# Patient Record
Sex: Female | Born: 1950 | Hispanic: No | Smoking: Never smoker
Health system: Southern US, Community
[De-identification: ages and names within clinical notes are randomized; demographics above are authoritative.]

## PROBLEM LIST (undated history)

## (undated) DIAGNOSIS — E119 Type 2 diabetes mellitus without complications: Secondary | ICD-10-CM

## (undated) HISTORY — PX: ABDOMINAL HYSTERECTOMY: SHX81

## (undated) HISTORY — DX: Type 2 diabetes mellitus without complications: E11.9

## (undated) HISTORY — PX: TONSILLECTOMY AND ADENOIDECTOMY: SHX28

## (undated) HISTORY — PX: CATARACT EXTRACTION, BILATERAL: SHX1313

---

## 2002-03-09 ENCOUNTER — Encounter: Admission: RE | Admit: 2002-03-09 | Discharge: 2002-03-09 | Payer: Self-pay | Admitting: *Deleted

## 2002-03-09 ENCOUNTER — Encounter: Payer: Self-pay | Admitting: *Deleted

## 2014-10-26 ENCOUNTER — Encounter: Payer: Self-pay | Admitting: Nurse Practitioner

## 2014-10-26 ENCOUNTER — Encounter (INDEPENDENT_AMBULATORY_CARE_PROVIDER_SITE_OTHER): Payer: Self-pay

## 2014-10-26 ENCOUNTER — Ambulatory Visit (INDEPENDENT_AMBULATORY_CARE_PROVIDER_SITE_OTHER): Payer: BC Managed Care – PPO | Admitting: Nurse Practitioner

## 2014-10-26 ENCOUNTER — Ambulatory Visit (INDEPENDENT_AMBULATORY_CARE_PROVIDER_SITE_OTHER): Payer: BC Managed Care – PPO

## 2014-10-26 VITALS — BP 156/84 | HR 75 | Temp 97.0°F | Ht 65.0 in | Wt 221.4 lb

## 2014-10-26 DIAGNOSIS — Z0000 Encounter for general adult medical examination without abnormal findings: Secondary | ICD-10-CM

## 2014-10-26 DIAGNOSIS — I10 Essential (primary) hypertension: Secondary | ICD-10-CM

## 2014-10-26 DIAGNOSIS — R42 Dizziness and giddiness: Secondary | ICD-10-CM

## 2014-10-26 MED ORDER — LISINOPRIL 20 MG PO TABS: 20.0000 mg | ORAL_TABLET | Freq: Every day | ORAL | Status: DC

## 2014-10-26 NOTE — Patient Instructions (Signed)

## 2014-10-26 NOTE — Progress Notes (Signed)
   Subjective:    Patient ID: Emma Nunez, female    DOB: [birth year], 63 y.o.   MRN: [id number]  HPI Patient in today c/o dizziness especially in the mornings when she gets up- gets better throughout the day. Has been going on for several months.    Review of Systems  Constitutional: Negative.   HENT: Negative.   Respiratory: Negative.   Cardiovascular: Negative.   Gastrointestinal: Negative.   Genitourinary: Negative.   Neurological: Positive for dizziness.  Psychiatric/Behavioral: Negative.   All other systems reviewed and are negative.      Objective:   Physical Exam  Constitutional: She is oriented to person, place, and time. She appears well-developed and well-nourished.  HENT:  Nose: Nose normal.  Mouth/Throat: Oropharynx is clear and moist.  Eyes: EOM are normal.  Neck: Trachea normal, normal range of motion and full passive range of motion without pain. Neck supple. No JVD present. Carotid bruit is not present. No thyromegaly present.  Cardiovascular: Normal rate, regular rhythm, normal heart sounds and intact distal pulses.  Exam reveals no gallop and no friction rub.   No murmur heard. Pulmonary/Chest: Effort normal and breath sounds normal.  Abdominal: Soft. Bowel sounds are normal. She exhibits no distension and no mass. There is no tenderness.  Musculoskeletal: Normal range of motion.  Lymphadenopathy:    She has no cervical adenopathy.  Neurological: She is alert and oriented to person, place, and time. She has normal reflexes.  Skin: Skin is warm and dry.  Psychiatric: She has a normal mood and affect. Her behavior is normal. Judgment and thought content normal.   BP 156/84 mmHg  Pulse 75  Temp(Src) 97 F (36.1 C) (Oral)  Ht _0  (1.651 m)  Wt 221 lb 6.4 oz (100.426 kg)  BMI 36.84 kg/m2  [name], FNP Chest x ray- no acute findings-Preliminary reading by Ronnald Collum, FNP  [facility]         Assessment & Plan:  1. Annual physical  exam - DG Chest 2 View; Future - EKG 12-Lead - CMP14+EGFR - NMR, lipoprofile - Thyroid Panel With TSH  2. Vertigo - Anemia Profile B  3. Essential hypertension, benign Low NA diet - lisinopril (PRINIVIL,ZESTRIL) 20 MG tablet; Take 1 tablet (20 mg total) by mouth daily.  Dispense: 90 tablet; Refill: 3    Labs pending Health maintenance reviewed Diet and exercise encouraged Continue all meds Follow up  In 6 months   [place], FNP

## 2014-10-27 LAB — THYROID PANEL WITH TSH
Free Thyroxine Index: 2 (ref 1.2–4.9)
T3 Uptake Ratio: 22 % — ABNORMAL LOW (ref 24–39)
T4, Total: 9.1 ug/dL (ref 4.5–12.0)
TSH: 1.78 u[IU]/mL (ref 0.450–4.500)

## 2014-10-27 LAB — ANEMIA PROFILE B
Basophils Absolute: 0 10*3/uL (ref 0.0–0.2)
Basos: 0 %
Eos: 3 %
Eosinophils Absolute: 0.2 10*3/uL (ref 0.0–0.4)
Ferritin: 165 ng/mL — ABNORMAL HIGH (ref 15–150)
Folate: 9.1 ng/mL (ref >3.0)
HCT: 41.5 % (ref 34.0–46.6)
Hemoglobin: 13.6 g/dL (ref 11.1–15.9)
Immature Grans (Abs): 0 10*3/uL (ref 0.0–0.1)
Immature Granulocytes: 0 %
Iron Saturation: 25 % (ref 15–55)
Iron: 73 ug/dL (ref 35–155)
Lymphocytes Absolute: 2.5 10*3/uL (ref 0.7–3.1)
Lymphs: 29 %
MCH: 29.2 pg (ref 26.6–33.0)
MCHC: 32.8 g/dL (ref 31.5–35.7)
MCV: 89 fL (ref 79–97)
Monocytes Absolute: 0.4 10*3/uL (ref 0.1–0.9)
Monocytes: 5 %
Neutrophils Absolute: 5.5 10*3/uL (ref 1.4–7.0)
Neutrophils Relative %: 63 %
Platelets: 358 10*3/uL (ref 150–379)
RBC: 4.66 x10E6/uL (ref 3.77–5.28)
RDW: 13.3 % (ref 12.3–15.4)
Retic Ct Pct: 1.9 % (ref 0.6–2.6)
TIBC: 290 ug/dL (ref 250–450)
UIBC: 217 ug/dL (ref 150–375)
Vitamin B-12: 204 pg/mL — ABNORMAL LOW (ref 211–946)
WBC: 8.8 10*3/uL (ref 3.4–10.8)

## 2014-10-27 LAB — NMR, LIPOPROFILE
Cholesterol: 298 mg/dL — ABNORMAL HIGH (ref 100–199)
HDL Cholesterol by NMR: 36 mg/dL — ABNORMAL LOW (ref >39)
HDL Particle Number: 25.6 umol/L — ABNORMAL LOW (ref >=30.5)
LDL Particle Number: 2762 nmol/L — ABNORMAL HIGH (ref <1000)
LDL Size: 20.6 nm (ref >20.5)
LDL-C: 184 mg/dL — ABNORMAL HIGH (ref 0–99)
LP-IR Score: 90 — ABNORMAL HIGH (ref <=45)
Small LDL Particle Number: 1580 nmol/L — ABNORMAL HIGH (ref <=527)
Triglycerides by NMR: 392 mg/dL — ABNORMAL HIGH (ref 0–149)

## 2014-10-27 LAB — CMP14+EGFR
ALT: 32 IU/L (ref 0–32)
AST: 29 IU/L (ref 0–40)
Albumin/Globulin Ratio: 1.6 (ref 1.1–2.5)
Albumin: 4.5 g/dL (ref 3.6–4.8)
Alkaline Phosphatase: 128 IU/L — ABNORMAL HIGH (ref 39–117)
BUN/Creatinine Ratio: 16 (ref 11–26)
BUN: 12 mg/dL (ref 8–27)
CO2: 22 mmol/L (ref 18–29)
Calcium: 9.7 mg/dL (ref 8.7–10.3)
Chloride: 100 mmol/L (ref 97–108)
Creatinine, Ser: 0.73 mg/dL (ref 0.57–1.00)
GFR calc Af Amer: 101 mL/min/{1.73_m2} (ref >59)
GFR calc non Af Amer: 88 mL/min/{1.73_m2} (ref >59)
Globulin, Total: 2.8 g/dL (ref 1.5–4.5)
Glucose: 89 mg/dL (ref 65–99)
Potassium: 4.7 mmol/L (ref 3.5–5.2)
Sodium: 140 mmol/L (ref 134–144)
Total Bilirubin: 0.3 mg/dL (ref 0.0–1.2)
Total Protein: 7.3 g/dL (ref 6.0–8.5)

## 2014-10-29 ENCOUNTER — Encounter: Payer: Self-pay | Admitting: Nurse Practitioner

## 2014-10-29 DIAGNOSIS — E785 Hyperlipidemia, unspecified: Secondary | ICD-10-CM | POA: Insufficient documentation

## 2014-10-29 DIAGNOSIS — I10 Essential (primary) hypertension: Secondary | ICD-10-CM | POA: Insufficient documentation

## 2014-10-29 DIAGNOSIS — D519 Vitamin B12 deficiency anemia, unspecified: Secondary | ICD-10-CM | POA: Insufficient documentation

## 2014-10-31 ENCOUNTER — Telehealth: Payer: Self-pay | Admitting: Nurse Practitioner

## 2014-10-31 NOTE — Telephone Encounter (Signed)
Aware of lab results. New script sent in of lipitor.,  Needs to start B-12 injections.

## 2014-11-05 ENCOUNTER — Ambulatory Visit (INDEPENDENT_AMBULATORY_CARE_PROVIDER_SITE_OTHER): Payer: BC Managed Care – PPO | Admitting: *Deleted

## 2014-11-05 DIAGNOSIS — E538 Deficiency of other specified B group vitamins: Secondary | ICD-10-CM

## 2014-11-05 MED ORDER — ATORVASTATIN CALCIUM 40 MG PO TABS: 40.0000 mg | ORAL_TABLET | Freq: Every day | ORAL | Status: DC

## 2014-11-05 MED ORDER — CYANOCOBALAMIN 1000 MCG/ML IJ SOLN
1000.0000 ug | Freq: Every day | INTRAMUSCULAR | Status: AC
  Administered 2014-11-05 – 2014-11-09 (×5): 1000 ug via INTRAMUSCULAR

## 2014-11-05 NOTE — Patient Instructions (Signed)
Vitamin B12 Injections Every person needs vitamin B12. A deficiency develops when the body does not get enough of it. One way to overcome this is by getting B12 shots (injections). A B12 shot puts the vitamin directly into muscle tissue. This avoids any problems your body might have in absorbing it from food or a pill. In some people, the body has trouble using the vitamin correctly. This can cause a B12 deficiency. Not consuming enough of the vitamin can also cause a deficiency. Getting enough vitamin B12 can be hard for elderly people. Sometimes, they do not eat a well-balanced diet. The elderly are also more likely than younger people to have medical conditions or take medications that can lead to a deficiency. WHAT DOES VITAMIN B12 DO? Vitamin B12 does many things to help the body work right:  It helps the body make healthy red blood cells.  It helps maintain nerve cells.  It is involved in the body's process of converting food into energy (metabolism).  It is needed to make the genetic material in all cells (DNA). VITAMIN B12 FOOD SOURCES Most people get plenty of vitamin B12 through the foods they eat. It is present in:  Meat, fish, poultry, and eggs.  Milk and milk products.  It also is added when certain foods are made, including some breads, cereals and yogurts. The food is then called "fortified". CAUSES The most common causes of vitamin B12 deficiency are:  Pernicious anemia. The condition develops when the body cannot make enough healthy red blood cells. This stems from a lack of a protein made in the stomach (intrinsic factor). People without this protein cannot absorb enough vitamin B12 from food.  Malabsorption. This is when the body cannot absorb the vitamin. It can be caused by:  Pernicious anemia.  Surgery to remove part or all of the stomach can lead to malabsorption. Removal of part or all of the small intestine can also cause malabsorption.  Vegetarian diet.  People who are strict about not eating foods from animals could have trouble taking in enough vitamin B12 from diet alone.  Medications. Some medicines have been linked to B12 deficiency, such as Metformin (a drug prescribed for type 2 diabetes). Long-term use of stomach acid suppressants also can keep the vitamin from being absorbed.  Intestinal problems such as inflammatory bowel disease. If there are problems in the digestive tract, vitamin B12 may not be absorbed in good enough amounts. SYMPTOMS People who do not get enough B12 can develop problems. These can include:  Anemia. This is when the body has too few red blood cells. Red blood cells carry oxygen to the rest of the body. Without a healthy supply of red blood cells, people can feel:  Tired (fatigued).  Weak.  Severe anemia can cause:  Shortness of breath.  Dizziness.  Rapid heart rate.  Paleness.  Other Vitamin B12 deficiency symptoms include:  Diarrhea.  Numbness or tingling in the hands or feet.  Loss of appetite.  Confusion.  Sores on the tongue or in the mouth. LET YOUR CAREGIVER KNOW ABOUT:  Any allergies. It is very important to know if you are allergic or sensitive to cobalt. Vitamin B12 contains cobalt.  Any history of kidney disease.  All medications you are taking. Include prescription and over-the-counter medicines, herbs and creams.  Whether you are pregnant or breast-feeding.  If you have Leber's disease, a hereditary eye condition, vitamin B12 could make it worse. RISKS AND COMPLICATIONS Reactions to an injection are   usually temporary. They might include:  Pain at the injection site.  Redness, swelling or tenderness at the site.  Headache, dizziness or weakness.  Nausea, upset stomach or diarrhea.  Numbness or tingling.  Fever.  Joint pain.  Itching or rash. If a reaction does not go away in a short while, talk with your healthcare provider. A change in the way the shots are  given, or where they are given, might need to be made. BEFORE AN INJECTION To decide whether B12 injections are right for you, your healthcare provider will probably:  Ask about your medical history.  Ask questions about your diet.  Ask about symptoms such as:  Have you felt weak?  Do you feel unusually tired?  Do you get dizzy?  Order blood tests. These may include a test to:  Check the level of red cells in your blood.  Measure B12 levels.  Check for the presence of intrinsic factor. VITAMIN B12 INJECTIONS How often you will need a vitamin B12 injection will depend on how severe your deficiency is. This also will affect how long you will need to get them. People with pernicious anemia usually get injections for their entire life. Others might get them for a shorter period. For many people, injections are given daily or weekly for several weeks. Then, once B12 levels are normal, injections are given just once a month. If the cause of the deficiency can be fixed, the injections can be stopped. Talk with your healthcare provider about what you should expect. For an injection:  The injection site will be cleaned with an alcohol swab.  Your healthcare provider will insert a needle directly into a muscle. Most any muscle can be used. Most often, an arm muscle is used. A buttocks muscle can also be used. Many people say shots in that area are less painful.  A small adhesive bandage may be put over the injection site. It usually can be taken off in an hour or less. Injections can be given by your healthcare provider. In some cases, family members give them. Sometimes, people give them to themselves. Talk with your healthcare provider about what would be best for you. If someone other than your healthcare provider will be giving the shots, the person will need to be trained to give them correctly. HOME CARE INSTRUCTIONS   You can remove the adhesive bandage within an hour of getting a  shot.  You should be able to go about your normal activities right away.  Avoid drinking large amounts of alcohol while taking vitamin B12 shots. Alcohol can interfere with the body's use of the vitamin. SEEK MEDICAL CARE IF:   Pain, redness, swelling or tenderness at the injection site does not get better or gets worse.  Headache, dizziness or weakness does not go away.  You develop a fever of more than 100.5 F (38.1 C). SEEK IMMEDIATE MEDICAL CARE IF:   You have chest pain.  You develop shortness of breath.  You have muscle weakness that gets worse.  You develop numbness, weakness or tingling on one side or one area of the body.  You have symptoms of an allergic reaction, such as:  Hives.  Difficulty breathing.  Swelling of the lips, face, tongue or throat.  You develop a fever of more than 102.0 F (38.9 C). MAKE SURE YOU:   Understand these instructions.  Will watch your condition.  Will get help right away if you are not doing well or get worse. Document   Released: 03/05/2009 Document Revised: 02/29/2012 Document Reviewed: 03/05/2009 Exit[employer] Patient Information 2015 [employer]. This information is not intended to replace advice given to you by your health care provider. Make sure you discuss any questions you have with your health care provider.  

## 2014-11-05 NOTE — Progress Notes (Signed)
Vitamin b12 given and tolerated well. 

## 2014-11-06 ENCOUNTER — Ambulatory Visit (INDEPENDENT_AMBULATORY_CARE_PROVIDER_SITE_OTHER): Payer: BC Managed Care – PPO | Admitting: *Deleted

## 2014-11-06 DIAGNOSIS — E538 Deficiency of other specified B group vitamins: Secondary | ICD-10-CM

## 2014-11-06 NOTE — Progress Notes (Signed)
Patient ID: Emma Nunez, female   DOB: 1951/04/16, [age] y.o.   MRN: [id number] Pt given B12 injection Im right deltoid. Pt tolerated injection well.

## 2014-11-06 NOTE — Patient Instructions (Signed)

## 2014-11-07 ENCOUNTER — Ambulatory Visit (INDEPENDENT_AMBULATORY_CARE_PROVIDER_SITE_OTHER): Payer: BC Managed Care – PPO | Admitting: *Deleted

## 2014-11-07 DIAGNOSIS — E538 Deficiency of other specified B group vitamins: Secondary | ICD-10-CM

## 2014-11-07 NOTE — Progress Notes (Signed)
Patient ID: Emma Nunez, female   DOB: 06-18-1951, 63 y.o.   MRN: [id number] Pt tolerated inj well

## 2014-11-08 ENCOUNTER — Ambulatory Visit (INDEPENDENT_AMBULATORY_CARE_PROVIDER_SITE_OTHER): Payer: BC Managed Care – PPO | Admitting: *Deleted

## 2014-11-08 DIAGNOSIS — E538 Deficiency of other specified B group vitamins: Secondary | ICD-10-CM

## 2014-11-08 NOTE — Patient Instructions (Signed)
Vitamin B12 Injections Every person needs vitamin B12. A deficiency develops when the body does not get enough of it. One way to overcome this is by getting B12 shots (injections). A B12 shot puts the vitamin directly into muscle tissue. This avoids any problems your body might have in absorbing it from food or a pill. In some people, the body has trouble using the vitamin correctly. This can cause a B12 deficiency. Not consuming enough of the vitamin can also cause a deficiency. Getting enough vitamin B12 can be hard for elderly people. Sometimes, they do not eat a well-balanced diet. The elderly are also more likely than younger people to have medical conditions or take medications that can lead to a deficiency. WHAT DOES VITAMIN B12 DO? Vitamin B12 does many things to help the body work right:  It helps the body make healthy red blood cells.  It helps maintain nerve cells.  It is involved in the body's process of converting food into energy (metabolism).  It is needed to make the genetic material in all cells (DNA). VITAMIN B12 FOOD SOURCES Most people get plenty of vitamin B12 through the foods they eat. It is present in:  Meat, fish, poultry, and eggs.  Milk and milk products.  It also is added when certain foods are made, including some breads, cereals and yogurts. The food is then called "fortified". CAUSES The most common causes of vitamin B12 deficiency are:  Pernicious anemia. The condition develops when the body cannot make enough healthy red blood cells. This stems from a lack of a protein made in the stomach (intrinsic factor). People without this protein cannot absorb enough vitamin B12 from food.  Malabsorption. This is when the body cannot absorb the vitamin. It can be caused by:  Pernicious anemia.  Surgery to remove part or all of the stomach can lead to malabsorption. Removal of part or all of the small intestine can also cause malabsorption.  Vegetarian diet.  People who are strict about not eating foods from animals could have trouble taking in enough vitamin B12 from diet alone.  Medications. Some medicines have been linked to B12 deficiency, such as Metformin (a drug prescribed for type 2 diabetes). Long-term use of stomach acid suppressants also can keep the vitamin from being absorbed.  Intestinal problems such as inflammatory bowel disease. If there are problems in the digestive tract, vitamin B12 may not be absorbed in good enough amounts. SYMPTOMS People who do not get enough B12 can develop problems. These can include:  Anemia. This is when the body has too few red blood cells. Red blood cells carry oxygen to the rest of the body. Without a healthy supply of red blood cells, people can feel:  Tired (fatigued).  Weak.  Severe anemia can cause:  Shortness of breath.  Dizziness.  Rapid heart rate.  Paleness.  Other Vitamin B12 deficiency symptoms include:  Diarrhea.  Numbness or tingling in the hands or feet.  Loss of appetite.  Confusion.  Sores on the tongue or in the mouth. LET YOUR CAREGIVER KNOW ABOUT:  Any allergies. It is very important to know if you are allergic or sensitive to cobalt. Vitamin B12 contains cobalt.  Any history of kidney disease.  All medications you are taking. Include prescription and over-the-counter medicines, herbs and creams.  Whether you are pregnant or breast-feeding.  If you have Leber's disease, a hereditary eye condition, vitamin B12 could make it worse. RISKS AND COMPLICATIONS Reactions to an injection are   usually temporary. They might include:  Pain at the injection site.  Redness, swelling or tenderness at the site.  Headache, dizziness or weakness.  Nausea, upset stomach or diarrhea.  Numbness or tingling.  Fever.  Joint pain.  Itching or rash. If a reaction does not go away in a short while, talk with your healthcare provider. A change in the way the shots are  given, or where they are given, might need to be made. BEFORE AN INJECTION To decide whether B12 injections are right for you, your healthcare provider will probably:  Ask about your medical history.  Ask questions about your diet.  Ask about symptoms such as:  Have you felt weak?  Do you feel unusually tired?  Do you get dizzy?  Order blood tests. These may include a test to:  Check the level of red cells in your blood.  Measure B12 levels.  Check for the presence of intrinsic factor. VITAMIN B12 INJECTIONS How often you will need a vitamin B12 injection will depend on how severe your deficiency is. This also will affect how long you will need to get them. People with pernicious anemia usually get injections for their entire life. Others might get them for a shorter period. For many people, injections are given daily or weekly for several weeks. Then, once B12 levels are normal, injections are given just once a month. If the cause of the deficiency can be fixed, the injections can be stopped. Talk with your healthcare provider about what you should expect. For an injection:  The injection site will be cleaned with an alcohol swab.  Your healthcare provider will insert a needle directly into a muscle. Most any muscle can be used. Most often, an arm muscle is used. A buttocks muscle can also be used. Many people say shots in that area are less painful.  A small adhesive bandage may be put over the injection site. It usually can be taken off in an hour or less. Injections can be given by your healthcare provider. In some cases, family members give them. Sometimes, people give them to themselves. Talk with your healthcare provider about what would be best for you. If someone other than your healthcare provider will be giving the shots, the person will need to be trained to give them correctly. HOME CARE INSTRUCTIONS   You can remove the adhesive bandage within an hour of getting a  shot.  You should be able to go about your normal activities right away.  Avoid drinking large amounts of alcohol while taking vitamin B12 shots. Alcohol can interfere with the body's use of the vitamin. SEEK MEDICAL CARE IF:   Pain, redness, swelling or tenderness at the injection site does not get better or gets worse.  Headache, dizziness or weakness does not go away.  You develop a fever of more than 100.5 F (38.1 C). SEEK IMMEDIATE MEDICAL CARE IF:   You have chest pain.  You develop shortness of breath.  You have muscle weakness that gets worse.  You develop numbness, weakness or tingling on one side or one area of the body.  You have symptoms of an allergic reaction, such as:  Hives.  Difficulty breathing.  Swelling of the lips, face, tongue or throat.  You develop a fever of more than 102.0 F (38.9 C). MAKE SURE YOU:   Understand these instructions.  Will watch your condition.  Will get help right away if you are not doing well or get worse. Document   Released: 03/05/2009 Document Revised: 02/29/2012 Document Reviewed: 03/05/2009 Exit[employer] Patient Information 2015 [employer]. This information is not intended to replace advice given to you by your health care provider. Make sure you discuss any questions you have with your health care provider.  

## 2014-11-08 NOTE — Progress Notes (Signed)
Vitamin b12 given and tolerated well. 

## 2014-11-09 ENCOUNTER — Ambulatory Visit (INDEPENDENT_AMBULATORY_CARE_PROVIDER_SITE_OTHER): Payer: BC Managed Care – PPO | Admitting: *Deleted

## 2014-11-09 DIAGNOSIS — E538 Deficiency of other specified B group vitamins: Secondary | ICD-10-CM

## 2014-11-09 NOTE — Progress Notes (Signed)
Pt given B12 injection IM left deltoid, pt tolerated injection well. 

## 2014-11-09 NOTE — Patient Instructions (Signed)

## 2014-11-14 ENCOUNTER — Ambulatory Visit (INDEPENDENT_AMBULATORY_CARE_PROVIDER_SITE_OTHER): Payer: BC Managed Care – PPO | Admitting: *Deleted

## 2014-11-14 DIAGNOSIS — E538 Deficiency of other specified B group vitamins: Secondary | ICD-10-CM

## 2014-11-14 MED ORDER — CYANOCOBALAMIN 1000 MCG/ML IJ SOLN
1000.0000 ug | INTRAMUSCULAR | Status: DC
  Administered 2014-11-14 – 2014-12-05 (×4): 1000 ug via INTRAMUSCULAR

## 2014-11-14 NOTE — Progress Notes (Signed)
Pt given B12 injection IM Right deltoid, pt tolerated injection well.

## 2014-11-14 NOTE — Patient Instructions (Signed)

## 2014-11-21 ENCOUNTER — Ambulatory Visit (INDEPENDENT_AMBULATORY_CARE_PROVIDER_SITE_OTHER): Payer: BC Managed Care – PPO | Admitting: *Deleted

## 2014-11-21 DIAGNOSIS — E538 Deficiency of other specified B group vitamins: Secondary | ICD-10-CM

## 2014-11-21 NOTE — Progress Notes (Signed)
B12 injection given IM left deltoid. Pt tolerated injection well.

## 2014-11-21 NOTE — Patient Instructions (Signed)

## 2014-11-28 ENCOUNTER — Ambulatory Visit (INDEPENDENT_AMBULATORY_CARE_PROVIDER_SITE_OTHER): Payer: BC Managed Care – PPO | Admitting: *Deleted

## 2014-11-28 DIAGNOSIS — E538 Deficiency of other specified B group vitamins: Secondary | ICD-10-CM

## 2014-11-28 NOTE — Progress Notes (Signed)
Vitamin b12 injection given and tolerated well.  

## 2014-11-28 NOTE — Patient Instructions (Signed)

## 2014-12-05 ENCOUNTER — Ambulatory Visit (INDEPENDENT_AMBULATORY_CARE_PROVIDER_SITE_OTHER): Payer: BC Managed Care – PPO | Admitting: *Deleted

## 2014-12-05 DIAGNOSIS — E538 Deficiency of other specified B group vitamins: Secondary | ICD-10-CM

## 2014-12-05 NOTE — Progress Notes (Signed)
Pt given b12 injection IM left deltoid, pt tolerated injection well.

## 2014-12-05 NOTE — Patient Instructions (Signed)

## 2015-01-07 ENCOUNTER — Ambulatory Visit (INDEPENDENT_AMBULATORY_CARE_PROVIDER_SITE_OTHER): Payer: BLUE CROSS/BLUE SHIELD | Admitting: *Deleted

## 2015-01-07 DIAGNOSIS — E538 Deficiency of other specified B group vitamins: Secondary | ICD-10-CM

## 2015-01-07 MED ORDER — CYANOCOBALAMIN 1000 MCG/ML IJ SOLN
1000.0000 ug | INTRAMUSCULAR | Status: AC
  Administered 2015-01-07 – 2025-01-24 (×74): 1000 ug via INTRAMUSCULAR

## 2015-01-07 NOTE — Patient Instructions (Signed)

## 2015-01-07 NOTE — Progress Notes (Signed)
Vitamin b12 injection given and tolerated well.  

## 2015-02-07 ENCOUNTER — Ambulatory Visit (INDEPENDENT_AMBULATORY_CARE_PROVIDER_SITE_OTHER): Payer: BLUE CROSS/BLUE SHIELD | Admitting: Nurse Practitioner

## 2015-02-07 ENCOUNTER — Encounter: Payer: Self-pay | Admitting: Nurse Practitioner

## 2015-02-07 VITALS — BP 160/92 | HR 71 | Temp 97.8°F | Ht 65.0 in | Wt 224.0 lb

## 2015-02-07 DIAGNOSIS — D519 Vitamin B12 deficiency anemia, unspecified: Secondary | ICD-10-CM

## 2015-02-07 DIAGNOSIS — Z13820 Encounter for screening for osteoporosis: Secondary | ICD-10-CM

## 2015-02-07 DIAGNOSIS — I10 Essential (primary) hypertension: Secondary | ICD-10-CM

## 2015-02-07 DIAGNOSIS — R739 Hyperglycemia, unspecified: Secondary | ICD-10-CM

## 2015-02-07 DIAGNOSIS — Z23 Encounter for immunization: Secondary | ICD-10-CM

## 2015-02-07 DIAGNOSIS — E785 Hyperlipidemia, unspecified: Secondary | ICD-10-CM

## 2015-02-07 DIAGNOSIS — E538 Deficiency of other specified B group vitamins: Secondary | ICD-10-CM

## 2015-02-07 DIAGNOSIS — Z6837 Body mass index (BMI) 37.0-37.9, adult: Secondary | ICD-10-CM | POA: Insufficient documentation

## 2015-02-07 DIAGNOSIS — R799 Abnormal finding of blood chemistry, unspecified: Secondary | ICD-10-CM

## 2015-02-07 MED ORDER — LISINOPRIL 40 MG PO TABS
40.0000 mg | ORAL_TABLET | Freq: Every day | ORAL | Status: DC
Start: 2015-02-07 — End: 2015-12-04

## 2015-02-07 NOTE — Progress Notes (Signed)
Subjective:    Patient ID: Emma Nunez, female    DOB: 04/25/1951, 63 y.o.   MRN: [id number]  Hyperlipidemia This is a chronic problem. The current episode started more than 1 year ago. The problem is uncontrolled. Recent lipid tests were reviewed and are high. Exacerbating diseases include obesity. She has no history of diabetes or hypothyroidism. There are no known factors aggravating her hyperlipidemia. Pertinent negatives include no focal sensory loss, leg pain or shortness of breath. Treatments tried: patient stopped lipitor because made her feel bad. Only took for 2 weeks. The current treatment provides moderate improvement of lipids. Compliance problems include adherence to diet and adherence to exercise.  Risk factors for coronary artery disease include dyslipidemia, hypertension, obesity and post-menopausal.  Hypertension This is a chronic problem. The current episode started more than 1 year ago. The problem has been waxing and waning since onset. The problem is resistant. Pertinent negatives include no blurred vision, headaches, neck pain, palpitations, peripheral edema or shortness of breath. Risk factors for coronary artery disease include dyslipidemia, obesity and sedentary lifestyle. Past treatments include ACE inhibitors. The current treatment provides no improvement. Compliance problems include diet and exercise.       Review of Systems  Constitutional: Negative.   HENT: Negative.   Eyes: Negative for blurred vision.  Respiratory: Negative for shortness of breath.   Cardiovascular: Negative for palpitations.  Genitourinary: Negative.   Musculoskeletal: Negative for neck pain.  Neurological: Negative for headaches.  Psychiatric/Behavioral: Negative.   All other systems reviewed and are negative.      Objective:   Physical Exam  Constitutional: She is oriented to person, place, and time. She appears well-developed and well-nourished.  HENT:  Nose: Nose normal.    Mouth/Throat: Oropharynx is clear and moist.  Eyes: EOM are normal.  Neck: Trachea normal, normal range of motion and full passive range of motion without pain. Neck supple. No JVD present. Carotid bruit is not present. No thyromegaly present.  Cardiovascular: Normal rate, regular rhythm, normal heart sounds and intact distal pulses.  Exam reveals no gallop and no friction rub.   No murmur heard. Pulmonary/Chest: Effort normal and breath sounds normal.  Abdominal: Soft. Bowel sounds are normal. She exhibits no distension and no mass. There is no tenderness.  Musculoskeletal: Normal range of motion.  Lymphadenopathy:    She has no cervical adenopathy.  Neurological: She is alert and oriented to person, place, and time. She has normal reflexes.  Skin: Skin is warm and dry.  Psychiatric: She has a normal mood and affect. Her behavior is normal. Judgment and thought content normal.    BP 160/92 mmHg  Pulse 71  Temp(Src) 97.8 F (36.6 C) (Oral)  Ht 5' 5" (1.651 m)  Wt 224 lb (101.606 kg)  BMI 37.28 kg/m2        Assessment & Plan:  1. Essential hypertension, benign Do not add salt to diet Increased lisinopril form 20 mg daily 40mg daily - lisinopril (PRINIVIL,ZESTRIL) 40 MG tablet; Take 1 tablet (40 mg total) by mouth daily.  Dispense: 90 tablet; Refill: 3 - CMP14+EGFR  2. Hyperlipidemia with target LDL less than 100 Patient will start back on lipitor - NMR, lipoprofile  3. BMI 37.0-37.9, adult Discussed diet and exercise for person with BMI >25 Will recheck weight in 3-6 months    dexascan ordered Labs pending Health maintenance reviewed Diet and exercise encouraged Continue all meds Follow up  In 3 months   Syna-Margaret Martin, FNP    

## 2015-02-07 NOTE — Addendum Note (Signed)
Addended by: Cleda DaubUCKER, Mandela Bello G on: 02/07/2015 11:22 AM   Modules accepted: Orders

## 2015-02-07 NOTE — Patient Instructions (Signed)
Fat and Cholesterol Control Diet Fat and cholesterol levels in your blood and organs are influenced by your diet. High levels of fat and cholesterol may lead to diseases of the heart, small and large blood vessels, gallbladder, liver, and pancreas. CONTROLLING FAT AND CHOLESTEROL WITH DIET Although exercise and lifestyle factors are important, your diet is key. That is because certain foods are known to raise cholesterol and others to lower it. The goal is to balance foods for their effect on cholesterol and more importantly, to replace saturated and trans fat with other types of fat, such as monounsaturated fat, polyunsaturated fat, and omega-3 fatty acids. On average, a person should consume no more than 15 to 17 g of saturated fat daily. Saturated and trans fats are considered "bad" fats, and they will raise LDL cholesterol. Saturated fats are primarily found in animal products such as meats, butter, and cream. However, that does not mean you need to give up all your favorite foods. Today, there are good tasting, low-fat, low-cholesterol substitutes for most of the things you like to eat. Choose low-fat or nonfat alternatives. Choose round or loin cuts of red meat. These types of cuts are lowest in fat and cholesterol. Chicken (without the skin), fish, veal, and ground turkey breast are great choices. Eliminate fatty meats, such as hot dogs and salami. Even shellfish have little or no saturated fat. Have a 3 oz (85 g) portion when you eat lean meat, poultry, or fish. Trans fats are also called "partially hydrogenated oils." They are oils that have been scientifically manipulated so that they are solid at room temperature resulting in a longer shelf life and improved taste and texture of foods in which they are added. Trans fats are found in stick margarine, some tub margarines, cookies, crackers, and baked goods.  When baking and cooking, oils are a great substitute for butter. The monounsaturated oils are  especially beneficial since it is believed they lower LDL and raise HDL. The oils you should avoid entirely are saturated tropical oils, such as coconut and palm.  Remember to eat a lot from food groups that are naturally free of saturated and trans fat, including fish, fruit, vegetables, beans, grains (barley, rice, couscous, bulgur wheat), and pasta (without cream sauces).  IDENTIFYING FOODS THAT LOWER FAT AND CHOLESTEROL  Soluble fiber may lower your cholesterol. This type of fiber is found in fruits such as apples, vegetables such as broccoli, potatoes, and carrots, legumes such as beans, peas, and lentils, and grains such as barley. Foods fortified with plant sterols (phytosterol) may also lower cholesterol. You should eat at least 2 g per day of these foods for a cholesterol lowering effect.  Read package labels to identify low-saturated fats, trans fat free, and low-fat foods at the supermarket. Select cheeses that have only 2 to 3 g saturated fat per ounce. Use a heart-healthy tub margarine that is free of trans fats or partially hydrogenated oil. When buying baked goods (cookies, crackers), avoid partially hydrogenated oils. Breads and muffins should be made from whole grains (whole-wheat or whole oat flour, instead of "flour" or "enriched flour"). Buy non-creamy canned soups with reduced salt and no added fats.  FOOD PREPARATION TECHNIQUES  Never deep-fry. If you must fry, either stir-fry, which uses very little fat, or use non-stick cooking sprays. When possible, broil, bake, or roast meats, and steam vegetables. Instead of putting butter or margarine on vegetables, use lemon and herbs, applesauce, and cinnamon (for squash and sweet potatoes). Use nonfat   yogurt, salsa, and low-fat dressings for salads.  LOW-SATURATED FAT / LOW-FAT FOOD SUBSTITUTES Meats / Saturated Fat (g)  Avoid: Steak, marbled (3 oz/85 g) / 11 g  Choose: Steak, lean (3 oz/85 g) / 4 g  Avoid: Hamburger (3 oz/85 g) / 7  g  Choose: Hamburger, lean (3 oz/85 g) / 5 g  Avoid: Ham (3 oz/85 g) / 6 g  Choose: Ham, lean cut (3 oz/85 g) / 2.4 g  Avoid: Chicken, with skin, dark meat (3 oz/85 g) / 4 g  Choose: Chicken, skin removed, dark meat (3 oz/85 g) / 2 g  Avoid: Chicken, with skin, light meat (3 oz/85 g) / 2.5 g  Choose: Chicken, skin removed, light meat (3 oz/85 g) / 1 g Dairy / Saturated Fat (g)  Avoid: Whole milk (1 cup) / 5 g  Choose: Low-fat milk, 2% (1 cup) / 3 g  Choose: Low-fat milk, 1% (1 cup) / 1.5 g  Choose: Skim milk (1 cup) / 0.3 g  Avoid: Hard cheese (1 oz/28 g) / 6 g  Choose: Skim milk cheese (1 oz/28 g) / 2 to 3 g  Avoid: Cottage cheese, 4% fat (1 cup) / 6.5 g  Choose: Low-fat cottage cheese, 1% fat (1 cup) / 1.5 g  Avoid: Ice cream (1 cup) / 9 g  Choose: Sherbet (1 cup) / 2.5 g  Choose: Nonfat frozen yogurt (1 cup) / 0.3 g  Choose: Frozen fruit bar / trace  Avoid: Whipped cream (1 tbs) / 3.5 g  Choose: Nondairy whipped topping (1 tbs) / 1 g Condiments / Saturated Fat (g)  Avoid: Mayonnaise (1 tbs) / 2 g  Choose: Low-fat mayonnaise (1 tbs) / 1 g  Avoid: Butter (1 tbs) / 7 g  Choose: Extra light margarine (1 tbs) / 1 g  Avoid: Coconut oil (1 tbs) / 11.8 g  Choose: Olive oil (1 tbs) / 1.8 g  Choose: Corn oil (1 tbs) / 1.7 g  Choose: Safflower oil (1 tbs) / 1.2 g  Choose: Sunflower oil (1 tbs) / 1.4 g  Choose: Soybean oil (1 tbs) / 2.4 g  Choose: Canola oil (1 tbs) / 1 g Document Released: 12/07/2005 Document Revised: 04/03/2013 Document Reviewed: 03/07/2014 [employer] Patient Information 2015 [employer]. This information is not intended to replace advice given to you by your health care provider. Make sure you discuss any questions you have with your health care provider.  

## 2015-02-08 LAB — NMR, LIPOPROFILE
Cholesterol: 306 mg/dL — ABNORMAL HIGH (ref 100–199)
HDL Cholesterol by NMR: 42 mg/dL (ref >39)
HDL Particle Number: 28.3 umol/L — ABNORMAL LOW (ref >=30.5)
LDL Particle Number: 3114 nmol/L — ABNORMAL HIGH (ref <1000)
LDL Size: 20.1 nm (ref >20.5)
LDL-C: 199 mg/dL — ABNORMAL HIGH (ref 0–99)
LP-IR Score: 100 — ABNORMAL HIGH (ref <=45)
Small LDL Particle Number: 1845 nmol/L — ABNORMAL HIGH (ref <=527)
Triglycerides by NMR: 323 mg/dL — ABNORMAL HIGH (ref 0–149)

## 2015-02-08 LAB — CMP14+EGFR
ALT: 35 IU/L — ABNORMAL HIGH (ref 0–32)
AST: 30 IU/L (ref 0–40)
Albumin/Globulin Ratio: 1.7 (ref 1.1–2.5)
Albumin: 4.4 g/dL (ref 3.6–4.8)
Alkaline Phosphatase: 146 IU/L — ABNORMAL HIGH (ref 39–117)
BUN/Creatinine Ratio: 19 (ref 11–26)
BUN: 13 mg/dL (ref 8–27)
Bilirubin Total: 0.3 mg/dL (ref 0.0–1.2)
CO2: 24 mmol/L (ref 18–29)
Calcium: 9.1 mg/dL (ref 8.7–10.3)
Chloride: 99 mmol/L (ref 97–108)
Creatinine, Ser: 0.69 mg/dL (ref 0.57–1.00)
GFR calc Af Amer: 107 mL/min/{1.73_m2} (ref >59)
GFR calc non Af Amer: 93 mL/min/{1.73_m2} (ref >59)
Globulin, Total: 2.6 g/dL (ref 1.5–4.5)
Glucose: 128 mg/dL — ABNORMAL HIGH (ref 65–99)
Potassium: 4.5 mmol/L (ref 3.5–5.2)
Sodium: 139 mmol/L (ref 134–144)
Total Protein: 7 g/dL (ref 6.0–8.5)

## 2015-02-12 ENCOUNTER — Telehealth: Payer: Self-pay | Admitting: Nurse Practitioner

## 2015-02-12 NOTE — Telephone Encounter (Signed)
-----   [facility], FNP sent at 02/08/2015  5:30 PM EST ----- Kidney and liver function stable Blood sugar elevated- add HGBA1c please LDL particle numbers are terrible as well as LDL- needs to be on crestor- rx sent to pharmacy Strict low fat diet and exercise and recheck in 6 months Trig should improve with  Low carb diet- may have diabetes waiting on hgba1c reslts

## 2015-02-13 LAB — POCT GLYCOSYLATED HEMOGLOBIN (HGB A1C): Hemoglobin A1C: 6.2

## 2015-02-13 NOTE — Addendum Note (Signed)
Addended by: Orma RenderHODGES, Mayci Haning F on: 02/13/2015 12:18 PM   Modules accepted: Orders

## 2015-02-15 NOTE — Telephone Encounter (Signed)
Patient aware.

## 2015-03-11 ENCOUNTER — Ambulatory Visit (INDEPENDENT_AMBULATORY_CARE_PROVIDER_SITE_OTHER): Payer: BLUE CROSS/BLUE SHIELD | Admitting: *Deleted

## 2015-03-11 DIAGNOSIS — E538 Deficiency of other specified B group vitamins: Secondary | ICD-10-CM

## 2015-03-11 NOTE — Patient Instructions (Signed)

## 2015-03-11 NOTE — Progress Notes (Signed)
Vitamin b12 injection given and tolerated well.  

## 2015-04-12 ENCOUNTER — Ambulatory Visit (INDEPENDENT_AMBULATORY_CARE_PROVIDER_SITE_OTHER): Payer: BLUE CROSS/BLUE SHIELD | Admitting: *Deleted

## 2015-04-12 DIAGNOSIS — E538 Deficiency of other specified B group vitamins: Secondary | ICD-10-CM

## 2015-04-12 NOTE — Progress Notes (Signed)
Vitamin b12 injection given and tolerated well.  

## 2015-04-12 NOTE — Patient Instructions (Signed)

## 2015-05-13 ENCOUNTER — Ambulatory Visit (INDEPENDENT_AMBULATORY_CARE_PROVIDER_SITE_OTHER): Payer: BLUE CROSS/BLUE SHIELD | Admitting: *Deleted

## 2015-05-13 DIAGNOSIS — E538 Deficiency of other specified B group vitamins: Secondary | ICD-10-CM

## 2015-05-13 NOTE — Patient Instructions (Signed)

## 2015-05-13 NOTE — Progress Notes (Signed)
Pt given B12 injection IM right deltoid and tolerated well. 

## 2015-10-07 ENCOUNTER — Telehealth: Payer: Self-pay | Admitting: Nurse Practitioner

## 2015-10-24 ENCOUNTER — Ambulatory Visit: Payer: BLUE CROSS/BLUE SHIELD | Admitting: Nurse Practitioner

## 2015-10-30 ENCOUNTER — Ambulatory Visit: Payer: BC Managed Care – PPO

## 2015-11-26 ENCOUNTER — Ambulatory Visit: Payer: BLUE CROSS/BLUE SHIELD | Admitting: Nurse Practitioner

## 2015-12-04 ENCOUNTER — Other Ambulatory Visit: Payer: Self-pay

## 2015-12-04 ENCOUNTER — Other Ambulatory Visit: Payer: Self-pay | Admitting: Nurse Practitioner

## 2015-12-04 DIAGNOSIS — I10 Essential (primary) hypertension: Secondary | ICD-10-CM

## 2015-12-04 MED ORDER — LISINOPRIL 40 MG PO TABS: 40.0000 mg | ORAL_TABLET | Freq: Every day | ORAL | Status: DC

## 2015-12-04 NOTE — Telephone Encounter (Signed)
Last seen and last lipid 02/07/15  MMM  Requesting 90 day supply

## 2015-12-04 NOTE — Telephone Encounter (Signed)
lipitor denied- Patient NTBS for follow up and lab work  

## 2015-12-04 NOTE — Telephone Encounter (Signed)
Patient has an appointment scheduled for February. She needs her hypertension medication and it was refilled.  She will not worry about cholesterol medication not having a refill.

## 2016-01-07 ENCOUNTER — Other Ambulatory Visit: Payer: Self-pay

## 2016-01-07 DIAGNOSIS — I10 Essential (primary) hypertension: Secondary | ICD-10-CM

## 2016-01-07 MED ORDER — LISINOPRIL 40 MG PO TABS: 40.0000 mg | ORAL_TABLET | Freq: Every day | ORAL | Status: DC

## 2016-01-07 NOTE — Telephone Encounter (Signed)
Last seen 02/08/15  MMM  Requesting 90 day supply 

## 2016-01-07 NOTE — Telephone Encounter (Signed)
Last seen 02/07/15 MMM  Requesting 90 day supply  This med was not on EPIC list

## 2016-01-07 NOTE — Telephone Encounter (Addendum)
lipitor rx denied- NTBS for labs Can only have 30 day supply of lisinopril because NTBS

## 2016-01-08 NOTE — Telephone Encounter (Signed)
She can wait until appointmnet - will be okay to be without for a few days

## 2016-01-08 NOTE — Telephone Encounter (Signed)
NA at home ?

## 2016-01-08 NOTE — Telephone Encounter (Signed)
Patient has appointment 2/7 will you approve lipitor until appointment

## 2016-01-09 ENCOUNTER — Other Ambulatory Visit: Payer: Self-pay

## 2016-01-09 NOTE — Telephone Encounter (Signed)
Last seen 02/07/15  MMM  Last lipid 02/07/15  This med was not on EPIC list

## 2016-01-09 NOTE — Telephone Encounter (Signed)
Patient NTBS for follow up and lab work before approving rx

## 2016-01-28 ENCOUNTER — Ambulatory Visit (INDEPENDENT_AMBULATORY_CARE_PROVIDER_SITE_OTHER): Payer: BLUE CROSS/BLUE SHIELD | Admitting: Nurse Practitioner

## 2016-01-28 ENCOUNTER — Encounter: Payer: Self-pay | Admitting: Nurse Practitioner

## 2016-01-28 ENCOUNTER — Ambulatory Visit (INDEPENDENT_AMBULATORY_CARE_PROVIDER_SITE_OTHER): Payer: BLUE CROSS/BLUE SHIELD

## 2016-01-28 VITALS — BP 158/90 | HR 79 | Temp 97.5°F | Ht 65.0 in | Wt 217.0 lb

## 2016-01-28 DIAGNOSIS — I10 Essential (primary) hypertension: Secondary | ICD-10-CM

## 2016-01-28 DIAGNOSIS — E785 Hyperlipidemia, unspecified: Secondary | ICD-10-CM | POA: Diagnosis not present

## 2016-01-28 DIAGNOSIS — Z1159 Encounter for screening for other viral diseases: Secondary | ICD-10-CM | POA: Diagnosis not present

## 2016-01-28 DIAGNOSIS — Z6837 Body mass index (BMI) 37.0-37.9, adult: Secondary | ICD-10-CM | POA: Diagnosis not present

## 2016-01-28 DIAGNOSIS — Z1212 Encounter for screening for malignant neoplasm of rectum: Secondary | ICD-10-CM

## 2016-01-28 DIAGNOSIS — Z23 Encounter for immunization: Secondary | ICD-10-CM | POA: Diagnosis not present

## 2016-01-28 MED ORDER — LISINOPRIL 40 MG PO TABS: 40.0000 mg | ORAL_TABLET | Freq: Every day | ORAL | Status: DC

## 2016-01-28 MED ORDER — ATORVASTATIN CALCIUM 40 MG PO TABS: ORAL_TABLET | ORAL | Status: DC

## 2016-01-28 MED ORDER — HYDROCHLOROTHIAZIDE 25 MG PO TABS: 25.0000 mg | ORAL_TABLET | Freq: Every day | ORAL | Status: DC

## 2016-01-28 NOTE — Addendum Note (Signed)
Addended by: Cleda Daub on: 01/28/2016 03:16 PM   Modules accepted: Orders

## 2016-01-28 NOTE — Progress Notes (Addendum)
Subjective:    Patient ID: Emma Nunez, female    DOB: 11/15/1951, 65 y.o.   MRN: [id number]  Patient here today for follow up of chronic medical problems.  Outpatient Encounter Prescriptions as of 01/28/2016  Medication Sig  . lisinopril (PRINIVIL,ZESTRIL) 40 MG tablet Take 1 tablet (40 mg total) by mouth daily.  Marland Kitchen atorvastatin (LIPITOR) 40 MG tablet Reported on 01/28/2016   Facility-Administered Encounter Medications as of 01/28/2016  Medication  . cyanocobalamin ((VITAMIN B-12)) injection 1,000 mcg     Hyperlipidemia This is a chronic problem. The current episode started more than 1 year ago. The problem is uncontrolled. Recent lipid tests were reviewed and are high. Exacerbating diseases include obesity. She has no history of diabetes or hypothyroidism. There are no known factors aggravating her hyperlipidemia. Pertinent negatives include no focal sensory loss, leg pain or shortness of breath. Treatments tried: Patient ran out of lipitor - has not ad since November. The current treatment provides moderate improvement of lipids. Compliance problems include adherence to diet and adherence to exercise.  Risk factors for coronary artery disease include dyslipidemia, hypertension, obesity and post-menopausal.  Hypertension This is a chronic problem. The current episode started more than 1 year ago. The problem has been waxing and waning since onset. The problem is resistant. Pertinent negatives include no blurred vision, headaches, neck pain, palpitations, peripheral edema or shortness of breath. Risk factors for coronary artery disease include dyslipidemia, obesity and sedentary lifestyle. Past treatments include ACE inhibitors. The current treatment provides no improvement. Compliance problems include diet and exercise.       Review of Systems  Constitutional: Negative.   HENT: Negative.   Eyes: Negative for blurred vision.  Respiratory: Negative for shortness of breath.   Cardiovascular:  Negative for palpitations.  Genitourinary: Negative.   Musculoskeletal: Negative for neck pain.  Neurological: Negative for headaches.  Psychiatric/Behavioral: Negative.   All other systems reviewed and are negative.      Objective:   Physical Exam  Constitutional: She is oriented to person, place, and time. She appears well-developed and well-nourished.  HENT:  Nose: Nose normal.  Mouth/Throat: Oropharynx is clear and moist.  Eyes: EOM are normal.  Neck: Trachea normal, normal range of motion and full passive range of motion without pain. Neck supple. No JVD present. Carotid bruit is not present. No thyromegaly present.  Cardiovascular: Normal rate, regular rhythm, normal heart sounds and intact distal pulses.  Exam reveals no gallop and no friction rub.   No murmur heard. Pulmonary/Chest: Effort normal and breath sounds normal.  Abdominal: Soft. Bowel sounds are normal. She exhibits no distension and no mass. There is no tenderness.  Musculoskeletal: Normal range of motion.  Lymphadenopathy:    She has no cervical adenopathy.  Neurological: She is alert and oriented to person, place, and time. She has normal reflexes.  Skin: Skin is warm and dry.  Psychiatric: She has a normal mood and affect. Her behavior is normal. Judgment and thought content normal.    BP 158/90 mmHg  Pulse 79  Temp(Src) 97.5 F (36.4 C) (Oral)  Ht '5\' 5"'  (1.651 m)  Wt 217 lb (98.431 kg)  BMI 36.11 kg/m2   Chest xray- no cardiopulmonary disease noted-Preliminary reading by Ronnald Collum, FNP  [facility]  EKG- Kerry Hough, FNP        Assessment & Plan:  1. Essential hypertension, benign Do not add salt to diet Added HCTZ to meds - lisinopril (PRINIVIL,ZESTRIL) 40 MG tablet; Take 1 tablet (  40 mg total) by mouth daily.  Dispense: 90 tablet; Refill: 0 - hydrochlorothiazide (HYDRODIURIL) 25 MG tablet; Take 1 tablet (25 mg total) by mouth daily.  Dispense: 30 tablet; Refill: 5 - CMP14+EGFR -  DG Chest 2 View; Future - EKG 12-Lead  2. Hyperlipidemia with target LDL less than 100 Low fta diet - atorvastatin (LIPITOR) 40 MG tablet; Reported on 01/28/2016  Dispense: 30 tablet; Refill: 5 - Lipid panel  3. BMI 37.0-37.9, adult Discussed diet and exercise for person with BMI >25 Will recheck weight in 3-6 months  4. Screening for malignant neoplasm of the rectum - Fecal occult blood, imunochemical; Future  5. Need for hepatitis C screening test - Hepatitis C antibody    Labs pending Health maintenance reviewed Diet and exercise encouraged Continue all meds Follow up  In 6 months   [place], FNP

## 2016-01-28 NOTE — Patient Instructions (Signed)
Hypertension Hypertension, commonly called high blood pressure, is when the force of blood pumping through your arteries is too strong. Your arteries are the blood vessels that carry blood from your heart throughout your body. A blood pressure reading consists of a higher number over a lower number, such as 110/72. The higher number (systolic) is the pressure inside your arteries when your heart pumps. The lower number (diastolic) is the pressure inside your arteries when your heart relaxes. Ideally you want your blood pressure below 120/80. Hypertension forces your heart to work harder to pump blood. Your arteries may become narrow or stiff. Having untreated or uncontrolled hypertension can cause heart attack, stroke, kidney disease, and other problems. RISK FACTORS Some risk factors for high blood pressure are controllable. Others are not.  Risk factors you cannot control include:   Race. You may be at higher risk if you are African American.  Age. Risk increases with age.  Gender. Men are at higher risk than women before age 45 years. After age 65, women are at higher risk than men. Risk factors you can control include:  Not getting enough exercise or physical activity.  Being overweight.  Getting too much fat, sugar, calories, or salt in your diet.  Drinking too much alcohol. SIGNS AND SYMPTOMS Hypertension does not usually cause signs or symptoms. Extremely high blood pressure (hypertensive crisis) may cause headache, anxiety, shortness of breath, and nosebleed. DIAGNOSIS To check if you have hypertension, your health care provider will measure your blood pressure while you are seated, with your arm held at the level of your heart. It should be measured at least twice using the same arm. Certain conditions can cause a difference in blood pressure between your right and left arms. A blood pressure reading that is higher than normal on one occasion does not mean that you need treatment. If  it is not clear whether you have high blood pressure, you may be asked to return on a different day to have your blood pressure checked again. Or, you may be asked to monitor your blood pressure at home for 1 or more weeks. TREATMENT Treating high blood pressure includes making lifestyle changes and possibly taking medicine. Living a healthy lifestyle can help lower high blood pressure. You may need to change some of your habits. Lifestyle changes may include:  Following the DASH diet. This diet is high in fruits, vegetables, and whole grains. It is low in salt, red meat, and added sugars.  Keep your sodium intake below 2,300 mg per day.  Getting at least 30-45 minutes of aerobic exercise at least 4 times per week.  Losing weight if necessary.  Not smoking.  Limiting alcoholic beverages.  Learning ways to reduce stress. Your health care provider may prescribe medicine if lifestyle changes are not enough to get your blood pressure under control, and if one of the following is true:  You are 18-59 years of age and your systolic blood pressure is above 140.  You are 60 years of age or older, and your systolic blood pressure is above 150.  Your diastolic blood pressure is above 90.  You have diabetes, and your systolic blood pressure is over 140 or your diastolic blood pressure is over 90.  You have kidney disease and your blood pressure is above 140/90.  You have heart disease and your blood pressure is above 140/90. Your personal target blood pressure may vary depending on your medical conditions, your age, and other factors. HOME CARE INSTRUCTIONS    Have your blood pressure rechecked as directed by your health care provider.   Take medicines only as directed by your health care provider. Follow the directions carefully. Blood pressure medicines must be taken as prescribed. The medicine does not work as well when you skip doses. Skipping doses also puts you at risk for  problems.  Do not smoke.   Monitor your blood pressure at home as directed by your health care provider. SEEK MEDICAL CARE IF:   You think you are having a reaction to medicines taken.  You have recurrent headaches or feel dizzy.  You have swelling in your ankles.  You have trouble with your vision. SEEK IMMEDIATE MEDICAL CARE IF:  You develop a severe headache or confusion.  You have unusual weakness, numbness, or feel faint.  You have severe chest or abdominal pain.  You vomit repeatedly.  You have trouble breathing. MAKE SURE YOU:   Understand these instructions.  Will watch your condition.  Will get help right away if you are not doing well or get worse.   This information is not intended to replace advice given to you by your health care provider. Make sure you discuss any questions you have with your health care provider.   Document Released: 12/07/2005 Document Revised: 04/23/2015 Document Reviewed: 09/29/2013 Elsevier Interactive Patient Education 2016 Elsevier Inc.  

## 2016-01-29 LAB — CMP14+EGFR
ALT: 26 IU/L (ref 0–32)
AST: 25 IU/L (ref 0–40)
Albumin/Globulin Ratio: 1.6 (ref 1.1–2.5)
Albumin: 4.3 g/dL (ref 3.6–4.8)
Alkaline Phosphatase: 139 IU/L — ABNORMAL HIGH (ref 39–117)
BUN/Creatinine Ratio: 17 (ref 11–26)
BUN: 12 mg/dL (ref 8–27)
Bilirubin Total: 0.3 mg/dL (ref 0.0–1.2)
CO2: 24 mmol/L (ref 18–29)
Calcium: 8.9 mg/dL (ref 8.7–10.3)
Chloride: 100 mmol/L (ref 96–106)
Creatinine, Ser: 0.72 mg/dL (ref 0.57–1.00)
GFR calc Af Amer: 102 mL/min/1.73
GFR calc non Af Amer: 89 mL/min/1.73
Globulin, Total: 2.7 g/dL (ref 1.5–4.5)
Glucose: 112 mg/dL — ABNORMAL HIGH (ref 65–99)
Potassium: 4.1 mmol/L (ref 3.5–5.2)
Sodium: 139 mmol/L (ref 134–144)
Total Protein: 7 g/dL (ref 6.0–8.5)

## 2016-01-29 LAB — LIPID PANEL
Chol/HDL Ratio: 7.5 ratio units — ABNORMAL HIGH (ref 0.0–4.4)
Cholesterol, Total: 298 mg/dL — ABNORMAL HIGH (ref 100–199)
HDL: 40 mg/dL (ref >39)
LDL Calculated: 183 mg/dL — ABNORMAL HIGH (ref 0–99)
Triglycerides: 373 mg/dL — ABNORMAL HIGH (ref 0–149)
VLDL Cholesterol Cal: 75 mg/dL — ABNORMAL HIGH (ref 5–40)

## 2016-01-29 LAB — HEPATITIS C ANTIBODY: Hep C Virus Ab: 0.1 s/co ratio (ref 0.0–0.9)

## 2016-01-30 ENCOUNTER — Other Ambulatory Visit: Payer: Self-pay | Admitting: Nurse Practitioner

## 2016-01-30 MED ORDER — FENOFIBRATE 160 MG PO TABS: 160.0000 mg | ORAL_TABLET | Freq: Every day | ORAL | Status: DC

## 2016-02-14 ENCOUNTER — Encounter: Payer: Self-pay | Admitting: *Deleted

## 2016-05-15 IMAGING — CR DG CHEST 2V
2 series · 2 of 2 positions shown · non-contrast
Comparison: None.

CLINICAL DATA: Annual physical exam.

EXAM:
CHEST  2 VIEW

[view not recorded (1 of 2)]
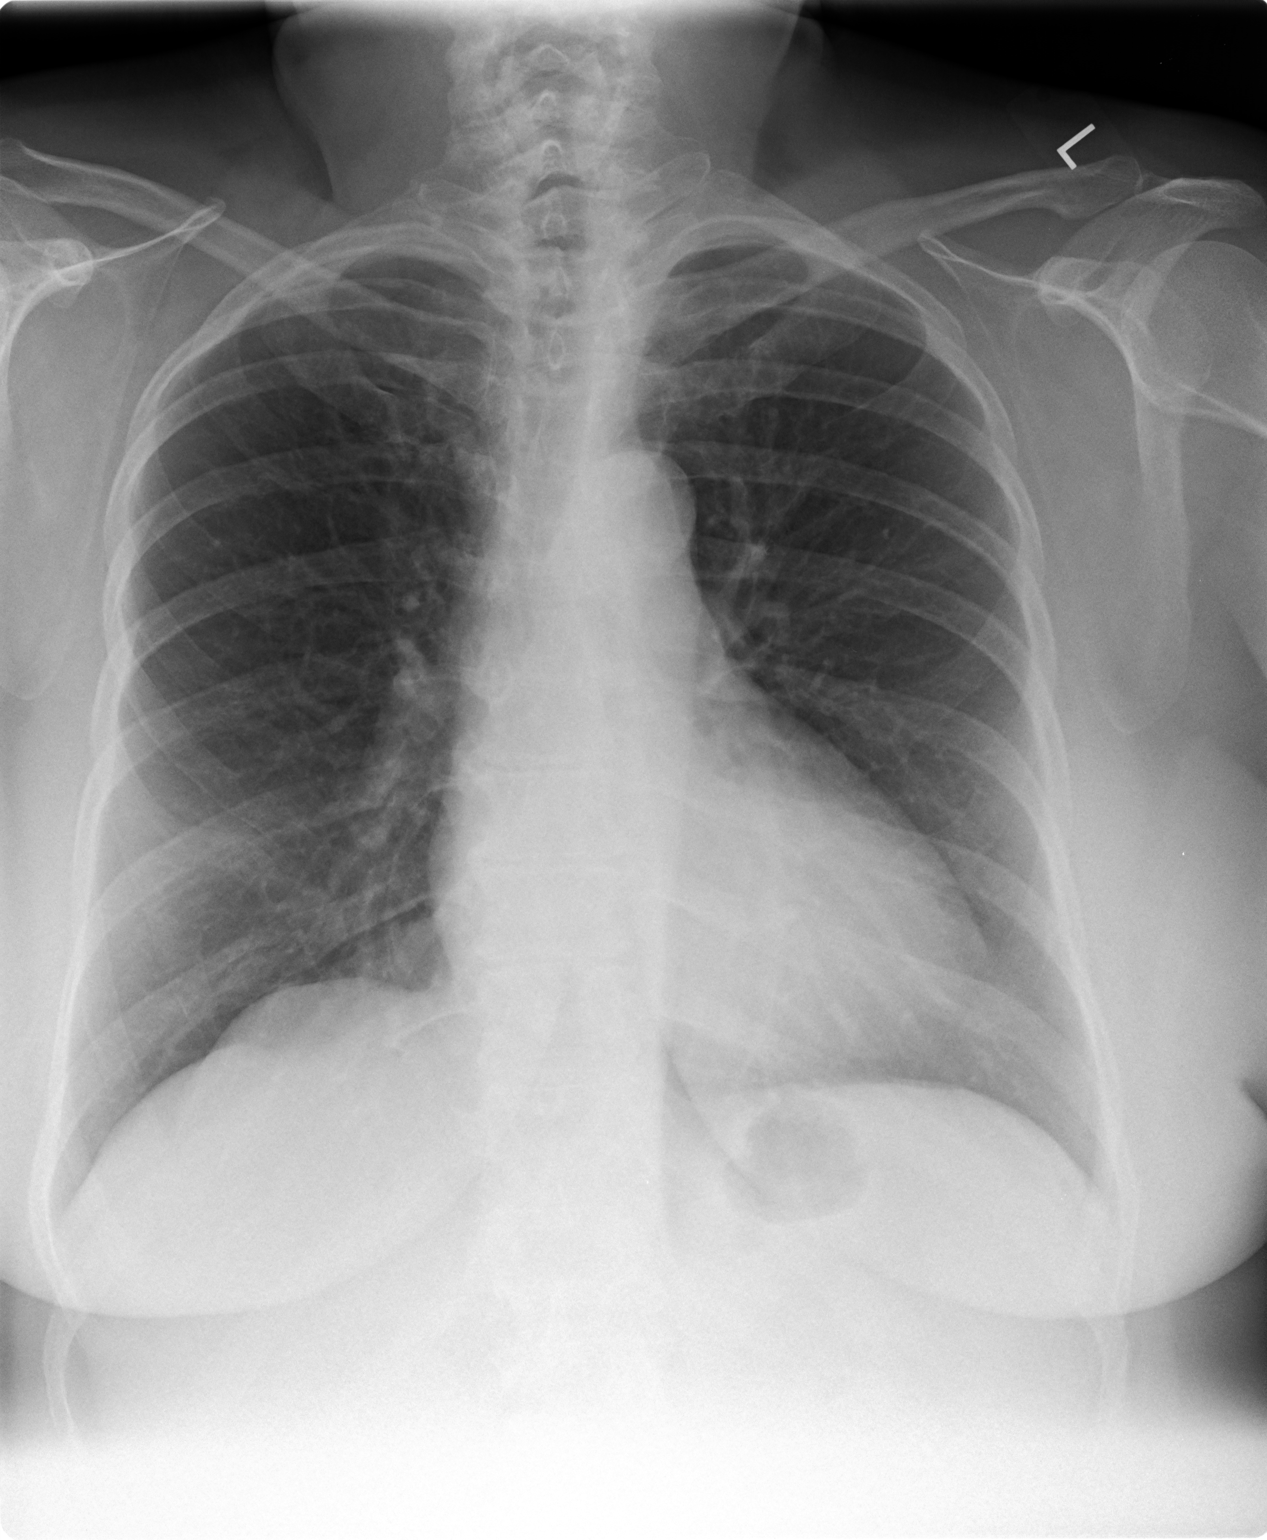

[view not recorded (2 of 2)]
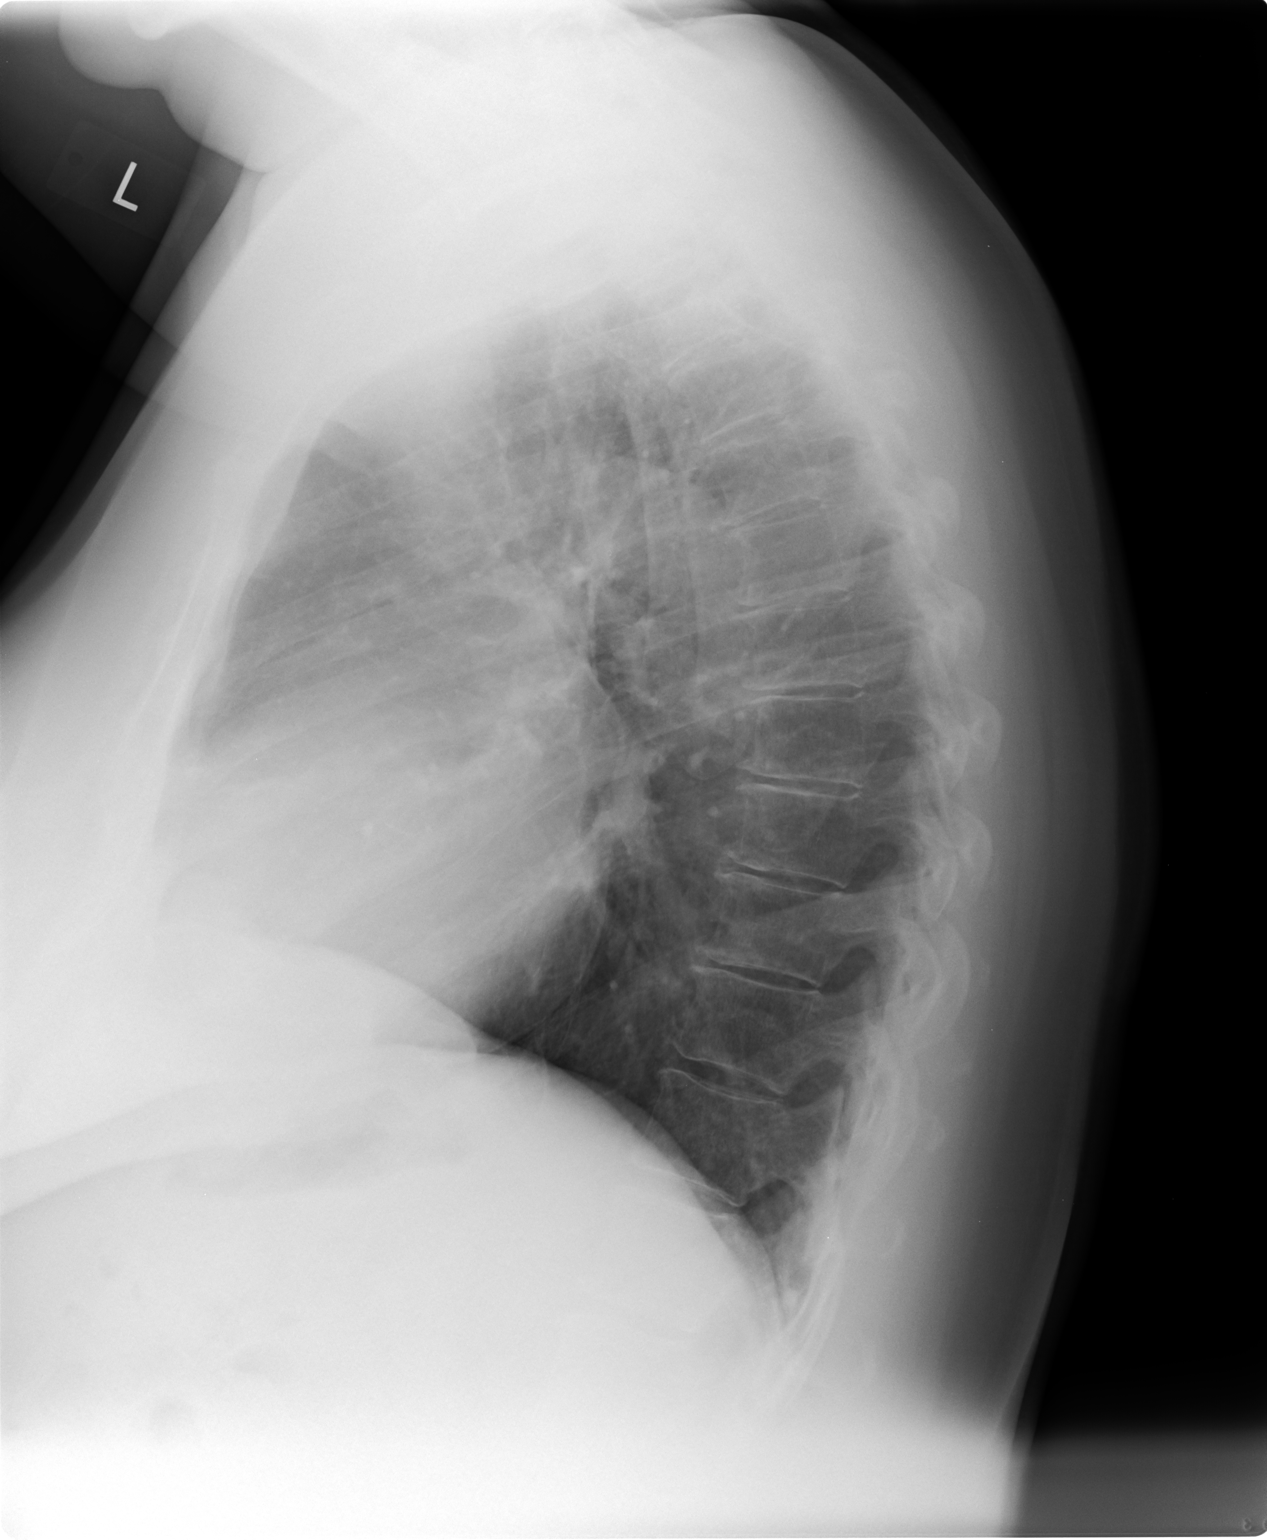

[2 of 2 positions shown; findings below may reference images not displayed]

FINDINGS: Heart and mediastinal contours are within normal limits. No focal
opacities or effusions. No acute bony abnormality. Bifid anterior
right sixth rib.
IMPRESSION: No active cardiopulmonary disease.

## 2016-07-21 ENCOUNTER — Other Ambulatory Visit: Payer: Self-pay | Admitting: Nurse Practitioner

## 2016-07-21 DIAGNOSIS — E785 Hyperlipidemia, unspecified: Secondary | ICD-10-CM

## 2016-07-21 DIAGNOSIS — I10 Essential (primary) hypertension: Secondary | ICD-10-CM

## 2016-07-28 ENCOUNTER — Ambulatory Visit (INDEPENDENT_AMBULATORY_CARE_PROVIDER_SITE_OTHER): Payer: BLUE CROSS/BLUE SHIELD | Admitting: Nurse Practitioner

## 2016-07-28 ENCOUNTER — Encounter: Payer: Self-pay | Admitting: Nurse Practitioner

## 2016-07-28 VITALS — BP 137/72 | HR 81 | Temp 98.6°F | Ht 65.0 in | Wt 217.0 lb

## 2016-07-28 DIAGNOSIS — H6502 Acute serous otitis media, left ear: Secondary | ICD-10-CM

## 2016-07-28 DIAGNOSIS — H9202 Otalgia, left ear: Secondary | ICD-10-CM

## 2016-07-28 DIAGNOSIS — K029 Dental caries, unspecified: Secondary | ICD-10-CM

## 2016-07-28 DIAGNOSIS — H6122 Impacted cerumen, left ear: Secondary | ICD-10-CM | POA: Diagnosis not present

## 2016-07-28 MED ORDER — AMOXICILLIN 875 MG PO TABS: 875.0000 mg | ORAL_TABLET | Freq: Two times a day (BID) | ORAL | 0 refills | Status: DC

## 2016-07-28 NOTE — Addendum Note (Signed)
Addended by: Bennie PieriniMARTIN, Berniece-MARGARET on: 07/28/2016 10:42 AM   Modules accepted: Orders

## 2016-07-28 NOTE — Patient Instructions (Signed)
Cerumen Impaction The structures of the external ear canal secrete a waxy substance known as cerumen. Excess cerumen can build up in the ear canal, causing a condition known as cerumen impaction. Cerumen impaction can cause ear pain and disrupt the function of the ear. The rate of cerumen production differs for each individual. In certain individuals, the configuration of the ear canal may decrease his or her ability to naturally remove cerumen. CAUSES Cerumen impaction is caused by excessive cerumen production or buildup. RISK FACTORS  Frequent use of swabs to clean ears.  Having narrow ear canals.  Having eczema.  Being dehydrated. SIGNS AND SYMPTOMS  Diminished hearing.  Ear drainage.  Ear pain.  Ear itch. TREATMENT Treatment may involve:  Over-the-counter or prescription ear drops to soften the cerumen.  Removal of cerumen by a health care provider. This may be done with:  Irrigation with warm water. This is the most common method of removal.  Ear curettes and other instruments.  Surgery. This may be done in severe cases. HOME CARE INSTRUCTIONS  Take medicines only as directed by your health care provider.  Do not insert objects into the ear with the intent of cleaning the ear. PREVENTION  Do not insert objects into the ear, even with the intent of cleaning the ear. Removing cerumen as a part of normal hygiene is not necessary, and the use of swabs in the ear canal is not recommended.  Drink enough water to keep your urine clear or pale yellow.  Control your eczema if you have it. SEEK MEDICAL CARE IF:  You develop ear pain.  You develop bleeding from the ear.  The cerumen does not clear after you use ear drops as directed.   This information is not intended to replace advice given to you by your health care provider. Make sure you discuss any questions you have with your health care provider.   Document Released: 01/14/2005 Document Revised: 12/28/2014  Document Reviewed: 07/24/2015 Elsevier Interactive Patient Education 2016 Elsevier Inc.  

## 2016-07-28 NOTE — Progress Notes (Addendum)
   Subjective:    Patient ID: Emma Nunez, female    DOB: 06/03/1951, 65 y.o.   MRN: [id number]  HPI Patient in today c/o left ear pain- started last night- she says it feels stopped up and decreased hearing- slight dizziness. She is also having a tooth ache on left top fr over a week and wonders if that is making her ear hurt. Tooth ache is intermittent.    Review of Systems  Constitutional: Negative.  Negative for fever.  HENT: Positive for ear pain. Negative for congestion, sinus pressure and trouble swallowing.   Respiratory: Positive for cough (slight).   Cardiovascular: Negative.   Gastrointestinal: Negative.   Genitourinary: Negative.   Neurological: Negative.   Psychiatric/Behavioral: Negative.   All other systems reviewed and are negative.      Objective:   Physical Exam  Constitutional: She appears well-developed and well-nourished. No distress.  HENT:  Right Ear: Hearing, tympanic membrane, external ear and ear canal normal.  Left Ear: Tympanic membrane normal. A foreign body (left cerumen impaction) is present.  Nose: Nose normal.  Mouth/Throat: Uvula is midline, oropharynx is clear and moist and mucous membranes are normal. Dental caries present.    Cardiovascular: Normal rate, regular rhythm and normal heart sounds.   Pulmonary/Chest: Effort normal and breath sounds normal.    BP 137/72   Pulse 81   Temp 98.6 F (37 C) (Oral)   Ht 5\' 5"  (1.651 m)   Wt 217 lb (98.4 kg)   BMI 36.11 kg/m   S/P left ear irrigation- TM normal    Assessment & Plan:  1. Dental caries noted on examination see dentist ASAP  2. Otalgia, left/ otitis media Motrin or tylenol OTC- could be referred pain from dental caries Meds ordered this encounter  Medications  . amoxicillin (AMOXIL) 875 MG tablet    Sig: Take 1 tablet (875 mg total) by mouth 2 (two) times daily. 1 po BID    Dispense:  20 tablet    Refill:  0    Order Specific Question:   Supervising Provider    Answer:    VINCENT, CAROL L [[id number]]    3. Cerumen impaction, left Debrox 2-3 x a week  RTO prn  Emma Daphine DeutscherMartin, FNP

## 2016-08-03 ENCOUNTER — Ambulatory Visit (INDEPENDENT_AMBULATORY_CARE_PROVIDER_SITE_OTHER): Payer: BLUE CROSS/BLUE SHIELD | Admitting: Nurse Practitioner

## 2016-08-03 ENCOUNTER — Encounter: Payer: Self-pay | Admitting: Nurse Practitioner

## 2016-08-03 VITALS — BP 146/87 | HR 74 | Temp 98.0°F | Ht 65.0 in | Wt 217.0 lb

## 2016-08-03 DIAGNOSIS — S01312A Laceration without foreign body of left ear, initial encounter: Secondary | ICD-10-CM

## 2016-08-03 DIAGNOSIS — H6522 Chronic serous otitis media, left ear: Secondary | ICD-10-CM | POA: Diagnosis not present

## 2016-08-03 MED ORDER — FLUTICASONE PROPIONATE 50 MCG/ACT NA SUSP: 2.0000 | Freq: Every day | NASAL | 6 refills | Status: DC

## 2016-08-03 MED ORDER — CIPROFLOXACIN-DEXAMETHASONE 0.3-0.1 % OT SUSP: 4.0000 [drp] | Freq: Two times a day (BID) | OTIC | 0 refills | Status: DC

## 2016-08-03 NOTE — Progress Notes (Signed)
   Subjective:    Patient ID: Emma Nunez, female    DOB: Jul 27, 1951, 65 y.o.   MRN: [id number]  HPI Patient comes in c/o decreased hearing on left ear- she was seen 1 week ago and was dx with cerumen impaction and otitis media- she was given amoxicillin and she says feels no better.    Review of Systems  Constitutional: Negative.   HENT: Positive for ear pain (left).   Respiratory: Negative.   Cardiovascular: Negative.   Genitourinary: Negative.   Neurological: Negative.   Psychiatric/Behavioral: Negative.   All other systems reviewed and are negative.      Objective:   Physical Exam  Constitutional: She is oriented to person, place, and time. She appears well-developed and well-nourished. No distress.  HENT:  Right Ear: Hearing, tympanic membrane, external ear and ear canal normal.  Left Ear: Left ear exhibits lacerations (canal). Tympanic membrane is not erythematous. A middle ear effusion (clear) is present.  Neck: Normal range of motion.  Cardiovascular: Normal rate, regular rhythm and normal heart sounds.   Pulmonary/Chest: Effort normal and breath sounds normal.  Abdominal: Soft. Bowel sounds are normal.  Neurological: She is alert and oriented to person, place, and time.  Skin: Skin is warm.  Psychiatric: She has a normal mood and affect. Her behavior is normal. Judgment and thought content normal.   BP (!) 146/87   Pulse 74   Temp 98 F (36.7 C) (Oral)   Ht 5\' 5"  (1.651 m)   Wt 217 lb (98.4 kg)   BMI 36.11 kg/m         Assessment & Plan:   1. Laceration of ear canal, left, initial encounter   2. Left chronic serous otitis media    Meds ordered this encounter  Medications  . ciprofloxacin-dexamethasone (CIPRODEX) otic suspension    Sig: Place 4 drops into the left ear 2 (two) times daily.    Dispense:  7.5 mL    Refill:  0    Order Specific Question:   Supervising Provider    Answer:   VINCENT, CAROL L [[id number]]  . fluticasone (FLONASE) 50 MCG/ACT nasal  spray    Sig: Place 2 sprays into both nostrils daily.    Dispense:  16 g    Refill:  6    Order Specific Question:   Supervising Provider    Answer:   Johna SheriffVINCENT, CAROL L [[id number]]   Force fluids otc decongestant If no better in 1 week will do ENT referral  Laekyn-Margaret Daphine DeutscherMartin, FNP

## 2016-08-10 ENCOUNTER — Telehealth: Payer: Self-pay | Admitting: Nurse Practitioner

## 2016-08-10 ENCOUNTER — Other Ambulatory Visit: Payer: Self-pay | Admitting: Nurse Practitioner

## 2016-08-10 DIAGNOSIS — H6522 Chronic serous otitis media, left ear: Secondary | ICD-10-CM

## 2016-08-10 NOTE — Telephone Encounter (Signed)
Should not need another antibiotic- ent referral made

## 2016-08-10 NOTE — Telephone Encounter (Signed)
Please review and advise.

## 2016-08-10 NOTE — Telephone Encounter (Signed)
Pt notified of referral Verbalizes understanding 

## 2016-08-17 ENCOUNTER — Other Ambulatory Visit: Payer: Self-pay | Admitting: Nurse Practitioner

## 2016-08-17 DIAGNOSIS — I10 Essential (primary) hypertension: Secondary | ICD-10-CM

## 2016-08-17 DIAGNOSIS — E785 Hyperlipidemia, unspecified: Secondary | ICD-10-CM

## 2016-08-25 ENCOUNTER — Telehealth: Payer: Self-pay

## 2016-08-25 DIAGNOSIS — H6522 Chronic serous otitis media, left ear: Secondary | ICD-10-CM

## 2016-08-25 NOTE — Telephone Encounter (Signed)
Referral corrected

## 2016-09-09 DIAGNOSIS — H6121 Impacted cerumen, right ear: Secondary | ICD-10-CM | POA: Diagnosis not present

## 2016-09-18 ENCOUNTER — Other Ambulatory Visit: Payer: Self-pay | Admitting: Nurse Practitioner

## 2016-09-21 ENCOUNTER — Other Ambulatory Visit: Payer: Self-pay | Admitting: Nurse Practitioner

## 2016-09-21 DIAGNOSIS — E785 Hyperlipidemia, unspecified: Secondary | ICD-10-CM

## 2016-09-29 ENCOUNTER — Other Ambulatory Visit: Payer: Self-pay | Admitting: Nurse Practitioner

## 2016-09-29 DIAGNOSIS — H6982 Other specified disorders of Eustachian tube, left ear: Secondary | ICD-10-CM | POA: Insufficient documentation

## 2016-09-29 DIAGNOSIS — I10 Essential (primary) hypertension: Secondary | ICD-10-CM

## 2016-09-29 DIAGNOSIS — H6123 Impacted cerumen, bilateral: Secondary | ICD-10-CM | POA: Diagnosis not present

## 2016-09-29 DIAGNOSIS — H6992 Unspecified Eustachian tube disorder, left ear: Secondary | ICD-10-CM | POA: Insufficient documentation

## 2016-10-27 ENCOUNTER — Other Ambulatory Visit: Payer: Self-pay | Admitting: Nurse Practitioner

## 2016-10-27 DIAGNOSIS — E785 Hyperlipidemia, unspecified: Secondary | ICD-10-CM

## 2016-11-25 ENCOUNTER — Other Ambulatory Visit: Payer: Self-pay | Admitting: Nurse Practitioner

## 2016-11-25 ENCOUNTER — Telehealth: Payer: Self-pay | Admitting: Nurse Practitioner

## 2016-12-27 ENCOUNTER — Other Ambulatory Visit: Payer: Self-pay | Admitting: Nurse Practitioner

## 2016-12-27 DIAGNOSIS — I10 Essential (primary) hypertension: Secondary | ICD-10-CM

## 2017-01-04 ENCOUNTER — Other Ambulatory Visit: Payer: Self-pay | Admitting: Nurse Practitioner

## 2017-01-06 NOTE — Telephone Encounter (Signed)
Fenofibrate refill denied- NTBS

## 2017-01-06 NOTE — Telephone Encounter (Signed)
Last labs 01/28/2016

## 2017-01-15 ENCOUNTER — Encounter: Payer: Self-pay | Admitting: Nurse Practitioner

## 2017-01-15 ENCOUNTER — Ambulatory Visit (INDEPENDENT_AMBULATORY_CARE_PROVIDER_SITE_OTHER): Payer: BLUE CROSS/BLUE SHIELD

## 2017-01-15 ENCOUNTER — Ambulatory Visit (INDEPENDENT_AMBULATORY_CARE_PROVIDER_SITE_OTHER): Payer: BLUE CROSS/BLUE SHIELD | Admitting: Nurse Practitioner

## 2017-01-15 VITALS — BP 161/89 | HR 71 | Temp 96.6°F | Ht 65.0 in | Wt 216.0 lb

## 2017-01-15 DIAGNOSIS — M25511 Pain in right shoulder: Secondary | ICD-10-CM

## 2017-01-15 DIAGNOSIS — M7551 Bursitis of right shoulder: Secondary | ICD-10-CM

## 2017-01-15 MED ORDER — PREDNISONE 10 MG (21) PO TBPK: ORAL_TABLET | ORAL | 0 refills | Status: DC

## 2017-01-15 NOTE — Patient Instructions (Signed)
Bursitis Introduction Bursitis is when the fluid-filled sac (bursa) that covers and protects a joint is swollen (inflamed). Bursitis is most common near joints, especially the knees, elbows, hips, and shoulders. Follow these instructions at home:  Take medicines only as told by your doctor.  If you were prescribed an antibiotic medicine, finish it all even if you start to feel better.  Rest the affected area as told by your doctor.  Keep the area raised up.  Avoid doing things that make the pain worse.  Apply ice to the injured area:  Place ice in a plastic bag.  Place a towel between your skin and the bag.  Leave the ice on for 20 minutes, 2-3 times a day.  Use splints, braces, pads, or walking aids as told by your doctor.  Keep all follow-up visits as told by your doctor. This is important. Contact a doctor if:  You have more pain with home care.  You have a fever.  You have chills. This information is not intended to replace advice given to you by your health care provider. Make sure you discuss any questions you have with your health care provider. Document Released: 05/27/2010 Document Revised: 05/14/2016 Document Reviewed: 02/26/2014  2017 Elsevier  

## 2017-01-15 NOTE — Progress Notes (Signed)
   Subjective:    Patient ID: Emma Nunez, female    DOB: [birth year], 66 y.o.   MRN: [id number]  HPI Patient comes in today c/o right shoulder pain- started a couple of months ago- hurts to raise it up over head. She cannot sleep on that side at night. SOmetimes pian radiates down into elbow. Rates 7/10. Resting it improves pain.    Review of Systems  Constitutional: Negative.   HENT: Negative.   Respiratory: Negative.   Cardiovascular: Negative.   Gastrointestinal: Negative.   Musculoskeletal: Positive for arthralgias.  Neurological: Negative.   Psychiatric/Behavioral: Negative.   All other systems reviewed and are negative.      Objective:   Physical Exam  Constitutional: She appears well-developed and well-nourished. No distress.  Cardiovascular: Normal rate, regular rhythm and normal heart sounds.   Pulmonary/Chest: Effort normal and breath sounds normal.  Musculoskeletal:  Decreased ROM of right shoulder with pain on abduction at aout 70 degrees. Motor strength and sensation distally intact Grips equal bil  Neurological: She is alert.  Skin: Skin is warm.  Psychiatric: She has a normal mood and affect. Her behavior is normal. Judgment and thought content normal.    BP (!) 161/89   Pulse 71   Temp (!) 96.6 F (35.9 C) (Oral)   Ht 5\' 5"  (1.651 m)   Wt 216 lb (98 kg)   BMI 35.94 kg/m   Right shoulder- slight AC separation-Preliminary reading by Paulene FloorMary Breland Trouten, FNP  [facility]      Assessment & Plan:  1. Pain in joint of right shoulder - DG Shoulder Right; Future  2. Bursitis of right shoulder Rest Ice BID RTO if no better in 1 week- or will do ortho referral - predniSONE (STERAPRED UNI-PAK 21 TAB) 10 MG (21) TBPK tablet; As directed x 6 days  Dispense: 21 tablet; Refill: 0  Joliana-Margaret Daphine DeutscherMartin, FNP

## 2017-01-24 ENCOUNTER — Other Ambulatory Visit: Payer: Self-pay | Admitting: Nurse Practitioner

## 2017-02-09 ENCOUNTER — Other Ambulatory Visit: Payer: Self-pay | Admitting: Nurse Practitioner

## 2017-02-09 DIAGNOSIS — I10 Essential (primary) hypertension: Secondary | ICD-10-CM

## 2017-02-22 ENCOUNTER — Other Ambulatory Visit: Payer: Self-pay | Admitting: Nurse Practitioner

## 2017-03-06 ENCOUNTER — Other Ambulatory Visit: Payer: Self-pay | Admitting: Nurse Practitioner

## 2017-03-06 DIAGNOSIS — E785 Hyperlipidemia, unspecified: Secondary | ICD-10-CM

## 2017-03-08 NOTE — Telephone Encounter (Signed)
lipitor refill denied- ntbs 

## 2017-03-10 ENCOUNTER — Other Ambulatory Visit: Payer: Self-pay | Admitting: Nurse Practitioner

## 2017-03-10 DIAGNOSIS — E785 Hyperlipidemia, unspecified: Secondary | ICD-10-CM

## 2017-03-11 NOTE — Telephone Encounter (Signed)
NA

## 2017-03-11 NOTE — Telephone Encounter (Signed)
Last lipids 02/17

## 2017-03-11 NOTE — Telephone Encounter (Signed)
lipitor denied- NTBS for labs

## 2017-03-30 ENCOUNTER — Encounter: Payer: Self-pay | Admitting: Nurse Practitioner

## 2017-03-30 ENCOUNTER — Ambulatory Visit (INDEPENDENT_AMBULATORY_CARE_PROVIDER_SITE_OTHER): Payer: BLUE CROSS/BLUE SHIELD | Admitting: Nurse Practitioner

## 2017-03-30 VITALS — BP 143/74 | HR 75 | Temp 97.1°F | Ht 65.0 in | Wt 216.0 lb

## 2017-03-30 DIAGNOSIS — Z6837 Body mass index (BMI) 37.0-37.9, adult: Secondary | ICD-10-CM | POA: Diagnosis not present

## 2017-03-30 DIAGNOSIS — E785 Hyperlipidemia, unspecified: Secondary | ICD-10-CM

## 2017-03-30 DIAGNOSIS — M25511 Pain in right shoulder: Secondary | ICD-10-CM | POA: Diagnosis not present

## 2017-03-30 DIAGNOSIS — D519 Vitamin B12 deficiency anemia, unspecified: Secondary | ICD-10-CM | POA: Diagnosis not present

## 2017-03-30 DIAGNOSIS — I10 Essential (primary) hypertension: Secondary | ICD-10-CM

## 2017-03-30 DIAGNOSIS — R739 Hyperglycemia, unspecified: Secondary | ICD-10-CM

## 2017-03-30 MED ORDER — HYDROCHLOROTHIAZIDE 25 MG PO TABS: 25.0000 mg | ORAL_TABLET | Freq: Every day | ORAL | 1 refills | Status: DC

## 2017-03-30 MED ORDER — LISINOPRIL 40 MG PO TABS: 40.0000 mg | ORAL_TABLET | Freq: Every day | ORAL | 1 refills | Status: DC

## 2017-03-30 MED ORDER — ATORVASTATIN CALCIUM 40 MG PO TABS: ORAL_TABLET | ORAL | 1 refills | Status: DC

## 2017-03-30 NOTE — Progress Notes (Signed)
Subjective:    Patient ID: Emma Nunez, female    DOB: 20-Sep-1951, 66 y.o.   MRN: [id number]  HPI  Emma Nunez is here today for follow up of chronic medical problem.  Outpatient Encounter Prescriptions as of 03/30/2017  Medication Sig  . atorvastatin (LIPITOR) 40 MG tablet TAKE 1 TABLET EVERY DAY MUST BE SEEN  . fenofibrate 160 MG tablet TAKE 1 TABLET (160 MG TOTAL) BY MOUTH DAILY.  . fluticasone (FLONASE) 50 MCG/ACT nasal spray Place 2 sprays into both nostrils daily.  . hydrochlorothiazide (HYDRODIURIL) 25 MG tablet TAKE 1 TABLET (25 MG TOTAL) BY MOUTH DAILY.  Marland Kitchen lisinopril (PRINIVIL,ZESTRIL) 40 MG tablet TAKE 1 TABLET (40 MG TOTAL) BY MOUTH DAILY.     1. Essential hypertension, benign  No c/o HA,SOB or chest pain  2. Anemia due to vitamin B12 deficiency, unspecified B12 deficiency type  Patient use to be on B12 injections but stopped due to insurance reasons- would like to start back on them  3. BMI 37.0-37.9, adult  No recent weight changes  4. Hyperlipidemia with target LDL less than 100  Does ot do good at watching diet    New complaints: Still having right shoulder pain- was seen 01/15/17- dx with bursitis- and gave steroid pack which helped with burning sensation. Still is not able to sleep on that shoulder and perform certain tasks due to pain.     Review of Systems  Constitutional: Positive for fatigue (slight). Negative for diaphoresis.  Eyes: Negative for pain.  Respiratory: Negative for shortness of breath.   Cardiovascular: Negative for chest pain, palpitations and leg swelling.  Gastrointestinal: Negative for abdominal pain.  Endocrine: Negative for polydipsia.  Musculoskeletal: Arthralgias: right shoulder.  Skin: Negative for rash.  Neurological: Negative for dizziness, weakness and headaches.  Hematological: Does not bruise/bleed easily.       Objective:   Physical Exam  Constitutional: She is oriented to person, place, and time. She appears  well-developed and well-nourished.  HENT:  Nose: Nose normal.  Mouth/Throat: Oropharynx is clear and moist.  Eyes: EOM are normal.  Neck: Trachea normal, normal range of motion and full passive range of motion without pain. Neck supple. No JVD present. Carotid bruit is not present. No thyromegaly present.  Cardiovascular: Normal rate, regular rhythm, normal heart sounds and intact distal pulses.  Exam reveals no gallop and no friction rub.   No murmur heard. Pulmonary/Chest: Effort normal and breath sounds normal.  Abdominal: Soft. Bowel sounds are normal. She exhibits no distension and no mass. There is no tenderness.  Musculoskeletal: Normal range of motion.  FROM of right shoulder with pain on full abduction and internal rotation Grips equal bil  Lymphadenopathy:    She has no cervical adenopathy.  Neurological: She is alert and oriented to person, place, and time. She has normal reflexes. No cranial nerve deficit.  Skin: Skin is warm and dry.  Psychiatric: She has a normal mood and affect. Her behavior is normal. Judgment and thought content normal.   BP (!) 143/74   Pulse 75   Temp 97.1 F (36.2 C) (Oral)   Ht '5\' 5"'  (1.651 m)   Wt 216 lb (98 kg)   BMI 35.94 kg/m      Assessment & Plan:  1. Essential hypertension, benign Low sodium diet - CMP14+EGFR - lisinopril (PRINIVIL,ZESTRIL) 40 MG tablet; Take 1 tablet (40 mg total) by mouth daily.  Dispense: 90 tablet; Refill: 1 - hydrochlorothiazide (HYDRODIURIL) 25 MG tablet; Take  1 tablet (25 mg total) by mouth daily.  Dispense: 90 tablet; Refill: 1  2. Anemia due to vitamin B12 deficiency, unspecified B12 deficiency type Will wait till get b12 levels back before starting shots - Vitamin B12  3. BMI 37.0-37.9, adult Discussed diet and exercise for person with BMI >25 Will recheck weight in 3-6 months  4. Hyperlipidemia with target LDL less than 100 Low fat diet - Lipid panel - atorvastatin (LIPITOR) 40 MG tablet; TAKE 1  TABLET EVERY DAY MUST BE SEEN  Dispense: 90 tablet; Refill: 1  5. Acute pain of right shoulder Ice if helps - Ambulatory referral to Orthopedic Surgery    Labs pending Health maintenance reviewed Diet and exercise encouraged Continue all meds Follow up  In 6 months   [place], FNP

## 2017-03-30 NOTE — Patient Instructions (Signed)
Health Maintenance, Female Adopting a healthy lifestyle and getting preventive care can go a long way to promote health and wellness. Talk with your health care provider about what schedule of regular examinations is right for you. This is a good chance for you to check in with your provider about disease prevention and staying healthy. In between checkups, there are plenty of things you can do on your own. Experts have done a lot of research about which lifestyle changes and preventive measures are most likely to keep you healthy. Ask your health care provider for more information. Weight and diet Eat a healthy diet  Be sure to include plenty of vegetables, fruits, low-fat dairy products, and lean protein.  Do not eat a lot of foods high in solid fats, added sugars, or salt.  Get regular exercise. This is one of the most important things you can do for your health.  Most adults should exercise for at least 150 minutes each week. The exercise should increase your heart rate and make you sweat (moderate-intensity exercise).  Most adults should also do strengthening exercises at least twice a week. This is in addition to the moderate-intensity exercise. Maintain a healthy weight  Body mass index (BMI) is a measurement that can be used to identify possible weight problems. It estimates body fat based on height and weight. Your health care provider can help determine your BMI and help you achieve or maintain a healthy weight.  For females 76 years of age and older:  A BMI below 18.5 is considered underweight.  A BMI of 18.5 to 24.9 is normal.  A BMI of 25 to 29.9 is considered overweight.  A BMI of 30 and above is considered obese. Watch levels of cholesterol and blood lipids  You should start having your blood tested for lipids and cholesterol at 66 years of age, then have this test every 5 years.  You may need to have your cholesterol levels checked more often if:  Your lipid or  cholesterol levels are high.  You are older than 66 years of age.  You are at high risk for heart disease. Cancer screening Lung Cancer  Lung cancer screening is recommended for adults 64-42 years old who are at high risk for lung cancer because of a history of smoking.  A yearly low-dose CT scan of the lungs is recommended for people who:  Currently smoke.  Have quit within the past 15 years.  Have at least a 30-pack-year history of smoking. A pack year is smoking an average of one pack of cigarettes a day for 1 year.  Yearly screening should continue until it has been 15 years since you quit.  Yearly screening should stop if you develop a health problem that would prevent you from having lung cancer treatment. Breast Cancer  Practice breast self-awareness. This means understanding how your breasts normally appear and feel.  It also means doing regular breast self-exams. Let your health care provider know about any changes, no matter how small.  If you are in your 20s or 30s, you should have a clinical breast exam (CBE) by a health care provider every 1-3 years as part of a regular health exam.  If you are 34 or older, have a CBE every year. Also consider having a breast X-ray (mammogram) every year.  If you have a family history of breast cancer, talk to your health care provider about genetic screening.  If you are at high risk for breast cancer, talk  to your health care provider about having an MRI and a mammogram every year.  Breast cancer gene (BRCA) assessment is recommended for women who have family members with BRCA-related cancers. BRCA-related cancers include:  Breast.  Ovarian.  Tubal.  Peritoneal cancers.  Results of the assessment will determine the need for genetic counseling and BRCA1 and BRCA2 testing. Cervical Cancer  Your health care provider may recommend that you be screened regularly for cancer of the pelvic organs (ovaries, uterus, and vagina).  This screening involves a pelvic examination, including checking for microscopic changes to the surface of your cervix (Pap test). You may be encouraged to have this screening done every 3 years, beginning at age 24.  For women ages 66-65, health care providers may recommend pelvic exams and Pap testing every 3 years, or they may recommend the Pap and pelvic exam, combined with testing for human papilloma virus (HPV), every 5 years. Some types of HPV increase your risk of cervical cancer. Testing for HPV may also be done on women of any age with unclear Pap test results.  Other health care providers may not recommend any screening for nonpregnant women who are considered low risk for pelvic cancer and who do not have symptoms. Ask your health care provider if a screening pelvic exam is right for you.  If you have had past treatment for cervical cancer or a condition that could lead to cancer, you need Pap tests and screening for cancer for at least 20 years after your treatment. If Pap tests have been discontinued, your risk factors (such as having a new sexual partner) need to be reassessed to determine if screening should resume. Some women have medical problems that increase the chance of getting cervical cancer. In these cases, your health care provider may recommend more frequent screening and Pap tests. Colorectal Cancer  This type of cancer can be detected and often prevented.  Routine colorectal cancer screening usually begins at 66 years of age and continues through 66 years of age.  Your health care provider may recommend screening at an earlier age if you have risk factors for colon cancer.  Your health care provider may also recommend using home test kits to check for hidden blood in the stool.  A small camera at the end of a tube can be used to examine your colon directly (sigmoidoscopy or colonoscopy). This is done to check for the earliest forms of colorectal cancer.  Routine  screening usually begins at age 41.  Direct examination of the colon should be repeated every 5-10 years through 66 years of age. However, you may need to be screened more often if early forms of precancerous polyps or small growths are found. Skin Cancer  Check your skin from head to toe regularly.  Tell your health care provider about any new moles or changes in moles, especially if there is a change in a mole's shape or color.  Also tell your health care provider if you have a mole that is larger than the size of a pencil eraser.  Always use sunscreen. Apply sunscreen liberally and repeatedly throughout the day.  Protect yourself by wearing long sleeves, pants, a wide-brimmed hat, and sunglasses whenever you are outside. Heart disease, diabetes, and high blood pressure  High blood pressure causes heart disease and increases the risk of stroke. High blood pressure is more likely to develop in:  People who have blood pressure in the high end of the normal range (130-139/85-89 mm Hg).  People who are overweight or obese.  People who are African American.  If you are 59-24 years of age, have your blood pressure checked every 3-5 years. If you are 34 years of age or older, have your blood pressure checked every year. You should have your blood pressure measured twice-once when you are at a hospital or clinic, and once when you are not at a hospital or clinic. Record the average of the two measurements. To check your blood pressure when you are not at a hospital or clinic, you can use:  An automated blood pressure machine at a pharmacy.  A home blood pressure monitor.  If you are between 29 years and 60 years old, ask your health care provider if you should take aspirin to prevent strokes.  Have regular diabetes screenings. This involves taking a blood sample to check your fasting blood sugar level.  If you are at a normal weight and have a low risk for diabetes, have this test once  every three years after 66 years of age.  If you are overweight and have a high risk for diabetes, consider being tested at a younger age or more often. Preventing infection Hepatitis B  If you have a higher risk for hepatitis B, you should be screened for this virus. You are considered at high risk for hepatitis B if:  You were born in a country where hepatitis B is common. Ask your health care provider which countries are considered high risk.  Your parents were born in a high-risk country, and you have not been immunized against hepatitis B (hepatitis B vaccine).  You have HIV or AIDS.  You use needles to inject street drugs.  You live with someone who has hepatitis B.  You have had sex with someone who has hepatitis B.  You get hemodialysis treatment.  You take certain medicines for conditions, including cancer, organ transplantation, and autoimmune conditions. Hepatitis C  Blood testing is recommended for:  Everyone born from [birth year] through 1965.  Anyone with known risk factors for hepatitis C. Sexually transmitted infections (STIs)  You should be screened for sexually transmitted infections (STIs) including gonorrhea and chlamydia if:  You are sexually active and are younger than 66 years of age.  You are older than 66 years of age and your health care provider tells you that you are at risk for this type of infection.  Your sexual activity has changed since you were last screened and you are at an increased risk for chlamydia or gonorrhea. Ask your health care provider if you are at risk.  If you do not have HIV, but are at risk, it may be recommended that you take a prescription medicine daily to prevent HIV infection. This is called pre-exposure prophylaxis (PrEP). You are considered at risk if:  You are sexually active and do not regularly use condoms or know the HIV status of your partner(s).  You take drugs by injection.  You are sexually active with a partner  who has HIV. Talk with your health care provider about whether you are at high risk of being infected with HIV. If you choose to begin PrEP, you should first be tested for HIV. You should then be tested every 3 months for as long as you are taking PrEP. Pregnancy  If you are premenopausal and you may become pregnant, ask your health care provider about preconception counseling.  If you may become pregnant, take 400 to 800 micrograms (mcg) of folic acid  every day.  If you want to prevent pregnancy, talk to your health care provider about birth control (contraception). Osteoporosis and menopause  Osteoporosis is a disease in which the bones lose minerals and strength with aging. This can result in serious bone fractures. Your risk for osteoporosis can be identified using a bone density scan.  If you are 4 years of age or older, or if you are at risk for osteoporosis and fractures, ask your health care provider if you should be screened.  Ask your health care provider whether you should take a calcium or vitamin D supplement to lower your risk for osteoporosis.  Menopause may have certain physical symptoms and risks.  Hormone replacement therapy may reduce some of these symptoms and risks. Talk to your health care provider about whether hormone replacement therapy is right for you. Follow these instructions at home:  Schedule regular health, dental, and eye exams.  Stay current with your immunizations.  Do not use any tobacco products including cigarettes, chewing tobacco, or electronic cigarettes.  If you are pregnant, do not drink alcohol.  If you are breastfeeding, limit how much and how often you drink alcohol.  Limit alcohol intake to no more than 1 drink per day for nonpregnant women. One drink equals 12 ounces of beer, 5 ounces of wine, or 1 ounces of hard liquor.  Do not use street drugs.  Do not share needles.  Ask your health care provider for help if you need support  or information about quitting drugs.  Tell your health care provider if you often feel depressed.  Tell your health care provider if you have ever been abused or do not feel safe at home. This information is not intended to replace advice given to you by your health care provider. Make sure you discuss any questions you have with your health care provider. Document Released: 06/22/2011 Document Revised: 05/14/2016 Document Reviewed: 09/10/2015 Elsevier Interactive Patient Education  2017 [employer].

## 2017-03-31 LAB — LIPID PANEL
Chol/HDL Ratio: 6.2 ratio — ABNORMAL HIGH (ref 0.0–4.4)
Cholesterol, Total: 290 mg/dL — ABNORMAL HIGH (ref 100–199)
HDL: 47 mg/dL
LDL Calculated: 173 mg/dL — ABNORMAL HIGH (ref 0–99)
Triglycerides: 352 mg/dL — ABNORMAL HIGH (ref 0–149)
VLDL Cholesterol Cal: 70 mg/dL — ABNORMAL HIGH (ref 5–40)

## 2017-03-31 LAB — CMP14+EGFR
ALT: 34 IU/L — ABNORMAL HIGH (ref 0–32)
AST: 29 IU/L (ref 0–40)
Albumin/Globulin Ratio: 1.8 (ref 1.2–2.2)
Albumin: 4.5 g/dL (ref 3.6–4.8)
Alkaline Phosphatase: 88 IU/L (ref 39–117)
BUN/Creatinine Ratio: 16 (ref 12–28)
BUN: 16 mg/dL (ref 8–27)
Bilirubin Total: <0.2 mg/dL (ref 0.0–1.2)
CO2: 24 mmol/L (ref 18–29)
Calcium: 9.8 mg/dL (ref 8.7–10.3)
Chloride: 94 mmol/L — ABNORMAL LOW (ref 96–106)
Creatinine, Ser: 0.98 mg/dL (ref 0.57–1.00)
GFR calc Af Amer: 70 mL/min/{1.73_m2} (ref >59)
GFR calc non Af Amer: 61 mL/min/{1.73_m2} (ref >59)
Globulin, Total: 2.5 g/dL (ref 1.5–4.5)
Glucose: 390 mg/dL — ABNORMAL HIGH (ref 65–99)
Potassium: 4.8 mmol/L (ref 3.5–5.2)
Sodium: 141 mmol/L (ref 134–144)
Total Protein: 7 g/dL (ref 6.0–8.5)

## 2017-03-31 LAB — VITAMIN B12: Vitamin B-12: 285 pg/mL (ref 232–1245)

## 2017-04-01 ENCOUNTER — Other Ambulatory Visit: Payer: Self-pay | Admitting: Nurse Practitioner

## 2017-04-01 ENCOUNTER — Encounter: Payer: Self-pay | Admitting: Nurse Practitioner

## 2017-04-01 DIAGNOSIS — R739 Hyperglycemia, unspecified: Secondary | ICD-10-CM

## 2017-04-01 LAB — BAYER DCA HB A1C WAIVED: HB A1C (BAYER DCA - WAIVED): 8.7 % — ABNORMAL HIGH (ref <7.0)

## 2017-04-01 MED ORDER — METFORMIN HCL 500 MG PO TABS: 500.0000 mg | ORAL_TABLET | Freq: Two times a day (BID) | ORAL | 5 refills | Status: DC

## 2017-04-01 NOTE — Addendum Note (Signed)
Addended by: Margorie John on: 04/01/2017 02:19 PM   Modules accepted: Orders

## 2017-04-08 ENCOUNTER — Ambulatory Visit (INDEPENDENT_AMBULATORY_CARE_PROVIDER_SITE_OTHER): Payer: BLUE CROSS/BLUE SHIELD | Admitting: Orthopaedic Surgery

## 2017-04-08 ENCOUNTER — Encounter (INDEPENDENT_AMBULATORY_CARE_PROVIDER_SITE_OTHER): Payer: Self-pay | Admitting: Orthopaedic Surgery

## 2017-04-08 VITALS — BP 141/86 | HR 84 | Ht 65.0 in | Wt 200.0 lb

## 2017-04-08 DIAGNOSIS — M7541 Impingement syndrome of right shoulder: Secondary | ICD-10-CM

## 2017-04-08 NOTE — Progress Notes (Signed)
Office Visit Note   Patient: Emma Nunez           Date of Birth: 1951/10/04           MRN: [id number] Visit Date: 04/08/2017              Requested by: Bennie Pierini, FNP [address] [place], [place] [address] PCP: Bennie Pierini, FNP   Assessment & Plan: Visit Diagnoses:  1. Impingement syndrome of right shoulder          Significantly improved.   Plan: He can return if she has recurrent symptoms after the last prednisone pack last week she's gotten complete resolution of her symptoms. We discussed pathophysiology of the condition. Office follow-up.. Thank you for alignment is sharing her care  Follow-Up Instructions: Return if symptoms worsen or fail to improve.   Orders:  No orders of the defined types were placed in this encounter.  No orders of the defined types were placed in this encounter.     Procedures: No procedures performed   Clinical Data: No additional findings.   Subjective: Chief Complaint  Patient presents with  . Right Shoulder - Pain    HPI patient seen for impingement right shoulder. She was seen by Paulene Floor FNP and placed on a prednisone pack as well as follow-up Aleve for right shoulder pain. She had pain with outstretched reaching and overhead activities. X-ray had shown some mild acromioclavicular degenerative changes no glenohumeral changes. She not been able to sleep on her shoulder and head pain without such reaching overhead activities. No other joint symptoms no fever chills she denies neck pain no numbness or tingling.  Review of Systems impervious systems positive for hypertension diabetes acid reflux, previous hysterectomy 25 years ago. Shoulder impingement which after a Medrol Dosepak has completely resolved with full range of motion and no pain.   Objective: Vital Signs: BP (!) 141/86   Pulse 84   Ht  (1.651 m)   Wt 200 lb (90.7 kg)   BMI 33.28 kg/m   Physical Exam  Constitutional: She is oriented  to person, place, and time. She appears well-developed.  HENT:  Head: Normocephalic.  Right Ear: External ear normal.  Left Ear: External ear normal.  Eyes: Pupils are equal, round, and reactive to light.  Neck: No tracheal deviation present. No thyromegaly present.  Cardiovascular: Normal rate.   Pulmonary/Chest: Effort normal.  Abdominal: Soft.  Musculoskeletal:  Patient's full shoulder range of motion negative impingement negative drop arm test long head biceps is nontender before meals joint is normal negative cross arm adduction no brachioplexus tenderness notes a peripheral nerve entrapment extremities normal heel toe gait no synovitis of the wrists fingers. Her extremity pedal edema.  Neurological: She is alert and oriented to person, place, and time.  Skin: Skin is warm and dry.  Psychiatric: She has a normal mood and affect. Her behavior is normal.    Ortho Exam  Specialty Comments:  No specialty comments available.  Imaging: No results found.   PMFS History: Patient Active Problem List   Diagnosis Date Noted  . Diabetes mellitus without complication (HCC) 04/01/2017  . BMI 37.0-37.9, adult 02/07/2015  . Essential hypertension, benign 10/29/2014  . Hyperlipidemia with target LDL less than 100 10/29/2014  . B12 deficiency anemia 10/29/2014   No past medical history on file.  Family History  Problem Relation Age of Onset  . COPD Father     Past Surgical History:  Procedure Laterality  Date  . ABDOMINAL HYSTERECTOMY    . TONSILLECTOMY AND ADENOIDECTOMY     Social History   Occupational History  . Not on file.   Social History Main Topics  . Smoking status: Never Smoker  . Smokeless tobacco: Never Used  . Alcohol use No  . Drug use: No  . Sexual activity: Not on file

## 2017-04-27 LAB — HM DIABETES EYE EXAM

## 2017-04-29 ENCOUNTER — Ambulatory Visit (INDEPENDENT_AMBULATORY_CARE_PROVIDER_SITE_OTHER): Payer: BLUE CROSS/BLUE SHIELD | Admitting: Pharmacist

## 2017-04-29 ENCOUNTER — Encounter: Payer: Self-pay | Admitting: Pharmacist

## 2017-04-29 ENCOUNTER — Other Ambulatory Visit: Payer: Self-pay | Admitting: *Deleted

## 2017-04-29 VITALS — BP 138/82 | HR 76 | Ht 65.0 in | Wt 214.0 lb

## 2017-04-29 DIAGNOSIS — E785 Hyperlipidemia, unspecified: Secondary | ICD-10-CM

## 2017-04-29 DIAGNOSIS — E119 Type 2 diabetes mellitus without complications: Secondary | ICD-10-CM

## 2017-04-29 MED ORDER — LANCETS MICRO THIN 33G MISC: 3 refills | Status: AC

## 2017-04-29 MED ORDER — GLUCOSE BLOOD VI STRP: ORAL_STRIP | 3 refills | Status: DC

## 2017-04-29 MED ORDER — FENOFIBRATE 160 MG PO TABS: 160.0000 mg | ORAL_TABLET | Freq: Every day | ORAL | 0 refills | Status: DC

## 2017-04-29 MED ORDER — D-CARE GLUCOMETER W/DEVICE KIT: 1.0000 | PACK | Freq: Every day | 0 refills | Status: DC

## 2017-04-29 NOTE — Progress Notes (Signed)
Patient ID: Emma Nunez, female   DOB: 15-May-1951, 66 y.o.   MRN: [id number]   Subjective:    Emma Nunez is a 66 y.o. female  Referred by her PCP for an initial evaluation of Type 2 diabetes mellitus.  Emma Nunez was diagnosed with type 2 DM from routine labs, 03/2017 based on RBG of 390 and A1c was 8.7%.  She denied any polyuria or polydipsia toay or at time of diagnosis.  Current symptoms/problems include hyperglycemia and have been improving. Symptoms have been present for 1 months.  Current diabetic medications include metformin 500mg  bid.   Current monitoring regimen: none Home blood sugar records: not checking Any episodes of hypoglycemia? no  Known diabetic complications: none Cardiovascular risk factors: advanced age (older than 4155 for men, 7265 for women), diabetes mellitus, dyslipidemia, hypertension, obesity (BMI >= 30 kg/m2) and sedentary lifestyle Eye exam current (within one year): yes Weight trend: stable Prior visit with CDE: no Current diet: in general, an "unhealthy" diet Current exercise: none Medication Compliance?  Yes   Is She on ACE inhibitor or angiotensin II receptor blocker?  Yes  lisinopril (Prinivil)    The following portions of the patient's history were reviewed and updated as appropriate: allergies, current medications and problem list.    Objective:    BP 138/82   Pulse 76   Ht 5\' 5"  (1.651 m)   Wt 214 lb (97.1 kg)   BMI 35.61 kg/m   Lab Review Glucose (mg/dL)  Date Value  25/36/644004/09/2017 390 (H)  01/28/2016 112 (H)  02/07/2015 128 (H)   CO2 (mmol/L)  Date Value  03/30/2017 24  01/28/2016 24  02/07/2015 24   BUN (mg/dL)  Date Value  34/74/259504/09/2017 16  01/28/2016 12  02/07/2015 13   Creatinine, Ser (mg/dL)  Date Value  63/87/564304/09/2017 0.98  01/28/2016 0.72  02/07/2015 0.69      Assessment:    Diabetes Mellitus type 2, newly diagnosed and under inadequate control.   Dyslipidemia - elevated Tg and LDL   Plan:    1.  Rx changes:  none 2.  Education: Reviewed 'ABCs' of diabetes management (respective goals in parentheses):  A1C (<6.5%), blood pressure (<140/90), and cholesterol (LDL <100). Discussed complications of DM and prevention 3. Discussed link between heart disease and DM. Patient to continue statin and ACE Inhibitor 4. CHO counting diet discussed.  Reviewed CHO amount in various foods and how to read nutrition labels.  Discussed recommended serving sizes.  5.  Recommend check BG 1  times a day - showed how to use glucometer in office and Rx sent to her pharmacy for glucometer, test strips and lancets. 6.  Recommended increase physical activity - goal is 150 minutes per week 7. Follow up: 2 months   - appt made today to f/u with PCP

## 2017-04-29 NOTE — Patient Instructions (Signed)
Diabetes and Standards of Medical Care   Diabetes is complicated. You may find that your diabetes team includes a dietitian, nurse, diabetes educator, eye doctor, and more. To help everyone know what is going on and to help you get the care you deserve, the following schedule of care was developed to help keep you on track. Below are the tests, exams, vaccines, medicines, education, and plans you will need.  Blood Glucose Goals Prior to meals = 80 - 130 Within 2 hours of the start of a meal = less than 180 Bedtime = 100 to 140  HbA1c test (goal is less than 7.0% - your last value was 8.7%) This test shows how well you have controlled your glucose over the past 2 to 3 months. It is used to see if your diabetes management plan needs to be adjusted.   It is performed at least 2 times a year if you are meeting treatment goals.  It is performed 4 times a year if therapy has changed or if you are not meeting treatment goals.  Blood pressure test  This test is performed at every routine medical visit. The goal is less than 140/90 mmHg for most people, but 130/80 mmHg in some cases. Ask your health care provider about your goal.  Dental exam  Follow up with the dentist regularly.  Eye exam  If you are diagnosed with type 1 diabetes as a child, get an exam upon reaching the age of 19 years or older and have had diabetes for 3 to 5 years. Yearly eye exams are recommended after that initial eye exam.  If you are diagnosed with type 1 diabetes as an adult, get an exam within 5 years of diagnosis and then yearly.  If you are diagnosed with type 2 diabetes, get an exam as soon as possible after the diagnosis and then yearly.  Foot care exam  Visual foot exams are performed at every routine medical visit. The exams check for cuts, injuries, or other problems with the feet.  A comprehensive foot exam should be done yearly. This includes visual inspection as well as assessing foot pulses and  testing for loss of sensation.  Check your feet nightly for cuts, injuries, or other problems with your feet. Tell your health care provider if anything is not healing.  Kidney function test (urine microalbumin)  This test is performed once a year.  Type 1 diabetes: The first test is performed 5 years after diagnosis.  Type 2 diabetes: The first test is performed at the time of diagnosis.  A serum creatinine and estimated glomerular filtration rate (eGFR) test is done once a year to assess the level of chronic kidney disease (CKD), if present.  Lipid profile (cholesterol, HDL, LDL, triglycerides)  Performed every 5 years for most people.  The goal for LDL is less than 100 mg/dL. If you are at high risk, the goal is less than 70 mg/dL. Your LDL was 173.  The atorvastatin will help lower LDL  The goal for HDL is 40 mg/dL to 50 mg/dL for men and 50 mg/dL to 60 mg/dL for women. An HDL cholesterol of 60 mg/dL or higher gives some protection against heart disease. Your HDL was 47.  The goal for triglycerides is less than 150 mg/dL. Your triglycerides were 352 but this should come down with diet changes and lower blood glucose.  Influenza vaccine, pneumococcal vaccine, and hepatitis B vaccine  The influenza vaccine is recommended yearly.  The pneumococcal vaccine  is generally given once in a lifetime. However, there are some instances when another vaccination is recommended. Check with your health care provider.  The hepatitis B vaccine is also recommended for adults with diabetes.  Diabetes self-management education  Education is recommended at diagnosis and ongoing as needed.  Treatment plan  Your treatment plan is reviewed at every medical visit.  Document Released: 10/04/2009 Document Revised: 08/09/2013 Document Reviewed: 05/09/2013 [facility] Patient Information 2014 [place].

## 2017-06-04 ENCOUNTER — Other Ambulatory Visit: Payer: Self-pay | Admitting: Pharmacist

## 2017-07-06 ENCOUNTER — Encounter: Payer: Self-pay | Admitting: Nurse Practitioner

## 2017-07-06 ENCOUNTER — Ambulatory Visit (INDEPENDENT_AMBULATORY_CARE_PROVIDER_SITE_OTHER): Payer: BLUE CROSS/BLUE SHIELD | Admitting: Nurse Practitioner

## 2017-07-06 VITALS — BP 132/86 | HR 70 | Temp 97.5°F | Ht 65.0 in | Wt 207.0 lb

## 2017-07-06 DIAGNOSIS — I10 Essential (primary) hypertension: Secondary | ICD-10-CM

## 2017-07-06 DIAGNOSIS — D519 Vitamin B12 deficiency anemia, unspecified: Secondary | ICD-10-CM | POA: Diagnosis not present

## 2017-07-06 DIAGNOSIS — E785 Hyperlipidemia, unspecified: Secondary | ICD-10-CM

## 2017-07-06 DIAGNOSIS — E119 Type 2 diabetes mellitus without complications: Secondary | ICD-10-CM | POA: Insufficient documentation

## 2017-07-06 DIAGNOSIS — Z6837 Body mass index (BMI) 37.0-37.9, adult: Secondary | ICD-10-CM | POA: Diagnosis not present

## 2017-07-06 LAB — BAYER DCA HB A1C WAIVED: HB A1C (BAYER DCA - WAIVED): 7 % — ABNORMAL HIGH (ref <7.0)

## 2017-07-06 MED ORDER — ATORVASTATIN CALCIUM 40 MG PO TABS: ORAL_TABLET | ORAL | 1 refills | Status: DC

## 2017-07-06 MED ORDER — LISINOPRIL 40 MG PO TABS: 40.0000 mg | ORAL_TABLET | Freq: Every day | ORAL | 1 refills | Status: DC

## 2017-07-06 MED ORDER — HYDROCHLOROTHIAZIDE 25 MG PO TABS: 25.0000 mg | ORAL_TABLET | Freq: Every day | ORAL | 1 refills | Status: DC

## 2017-07-06 NOTE — Addendum Note (Signed)
Addended by: Cleda DaubUCKER, AMANDA G on: 07/06/2017 11:35 AM   Modules accepted: Orders

## 2017-07-06 NOTE — Progress Notes (Signed)
Subjective:    Patient ID: Emma Nunez, female    DOB: 1951-08-10, 66 y.o.   MRN: [id number]  HPI  MIKIA DELALUZ is here today for follow up of chronic medical problem.  Outpatient Encounter Prescriptions as of 07/06/2017  Medication Sig  . atorvastatin (LIPITOR) 40 MG tablet TAKE 1 TABLET EVERY DAY MUST BE SEEN  . Blood Glucose Monitoring Suppl (CONTOUR NEXT MONITOR) w/Device KIT USE AS DIRECTED  . fenofibrate 160 MG tablet Take 1 tablet (160 mg total) by mouth daily.  . fluticasone (FLONASE) 50 MCG/ACT nasal spray Place 2 sprays into both nostrils daily.  Marland Kitchen glucose blood test strip Use to check BG once daily at varying times.  Dispense whichever glucometer and strips insurance will cover.  . hydrochlorothiazide (HYDRODIURIL) 25 MG tablet Take 1 tablet (25 mg total) by mouth daily.  Marland Kitchen LANCETS MICRO THIN 33G MISC Use to check BG once daily.  Dispense whichever goes with meter and that insurance will cover.  Marland Kitchen lisinopril (PRINIVIL,ZESTRIL) 40 MG tablet Take 1 tablet (40 mg total) by mouth daily.  . metFORMIN (GLUCOPHAGE) 500 MG tablet Take 1 tablet (500 mg total) by mouth 2 (two) times daily with a meal.   Facility-Administered Encounter Medications as of 07/06/2017  Medication  . cyanocobalamin ((VITAMIN B-12)) injection 1,000 mcg    1. Type 2 diabetes mellitus without complication, without long-term current use of insulin ([place]) last hgba1c was 8.7%. appointmnet was made for her to see Tammy eckard.to discuss blood sugar testing and carb counting. She was started on metformin 562m BID, Blood sugars have been ranging from 120-150's fasting.  2. Dyslipidemia  Trying to not eat slot of fried foods  3. Essential hypertension, benign   no c/o chest pain, SOB or headache  4. BMI 37.0-37.9, adult  No significant change sin weight since last visit  5. Anemia due to vitamin B12 deficiency, unspecified B12 deficiency type  Still getting month B12 injections- no c/o fatigue    New  complaints: None today  Social history: Lives by herself- both daughters have moved out- one daughter lives next door and other daughter lives out of town.    Review of Systems  Constitutional: Negative for activity change and appetite change.  HENT: Negative.   Eyes: Negative for pain.  Respiratory: Negative for shortness of breath.   Cardiovascular: Negative for chest pain, palpitations and leg swelling.  Gastrointestinal: Negative for abdominal pain.  Endocrine: Negative for polydipsia.  Genitourinary: Negative.   Skin: Negative for rash.  Neurological: Negative for dizziness, weakness and headaches.  Hematological: Does not bruise/bleed easily.  Psychiatric/Behavioral: Negative.   All other systems reviewed and are negative.      Objective:   Physical Exam  Constitutional: She is oriented to person, place, and time. She appears well-developed and well-nourished.  HENT:  Nose: Nose normal.  Mouth/Throat: Oropharynx is clear and moist.  Eyes: EOM are normal.  Neck: Trachea normal, normal range of motion and full passive range of motion without pain. Neck supple. No JVD present. Carotid bruit is not present. No thyromegaly present.  Cardiovascular: Normal rate, regular rhythm, normal heart sounds and intact distal pulses.  Exam reveals no gallop and no friction rub.   No murmur heard. Pulmonary/Chest: Effort normal and breath sounds normal.  Abdominal: Soft. Bowel sounds are normal. She exhibits no distension and no mass. There is no tenderness.  Musculoskeletal: Normal range of motion.  Lymphadenopathy:    She has no cervical adenopathy.  Neurological: She is alert and oriented to person, place, and time. She has normal reflexes.  Skin: Skin is warm and dry.  Psychiatric: She has a normal mood and affect. Her behavior is normal. Judgment and thought content normal.   BP 132/86   Pulse 70   Temp (!) 97.5 F (36.4 C) (Oral)   Ht '5\' 5"'  (1.651 m)   Wt 207 lb (93.9 kg)    BMI 34.45 kg/m   hgba1c 7.0%    Assessment & Plan:  1. Type 2 diabetes mellitus without complication, without long-term current use of insulin (HCC) Continue to watch carb son diet - Bayer DCA Hb A1c Waived - Microalbumin / creatinine urine ratio  2. Dyslipidemia Low fat diet and exercise - Lipid panel- atorvastatin (LIPITOR) 40 MG tablet; TAKE 1 TABLET EVERY DAY MUST BE SEEN  Dispense: 90 tablet; Refill: 1 . 3. Essential hypertension, benign Low sodium diet - CMP14+EGFR - lisinopril (PRINIVIL,ZESTRIL) 40 MG tablet; Take 1 tablet (40 mg total) by mouth daily.  Dispense: 90 tablet; Refill: 1 - hydrochlorothiazide (HYDRODIURIL) 25 MG tablet; Take 1 tablet (25 mg total) by mouth daily.  Dispense: 90 tablet; Refill: 1   4. BMI 37.0-37.9, adult Discussed diet and exercise for person with BMI >25 Will recheck weight in 3-6 months   5. Anemia due to vitamin B12 deficiency, unspecified B12 deficiency type Continue b12 injections monthly    Labs pending Health maintenance reviewed Diet and exercise encouraged Continue all meds Follow up  In 3 months   [place], FNP

## 2017-07-06 NOTE — Patient Instructions (Signed)

## 2017-07-07 LAB — CMP14+EGFR
ALT: 33 IU/L — ABNORMAL HIGH (ref 0–32)
AST: 32 IU/L (ref 0–40)
Albumin/Globulin Ratio: 1.9 (ref 1.2–2.2)
Albumin: 4.6 g/dL (ref 3.6–4.8)
Alkaline Phosphatase: 80 IU/L (ref 39–117)
BUN/Creatinine Ratio: 18 (ref 12–28)
BUN: 18 mg/dL (ref 8–27)
Bilirubin Total: 0.3 mg/dL (ref 0.0–1.2)
CO2: 23 mmol/L (ref 20–29)
Calcium: 9.9 mg/dL (ref 8.7–10.3)
Chloride: 98 mmol/L (ref 96–106)
Creatinine, Ser: 1 mg/dL (ref 0.57–1.00)
GFR calc Af Amer: 68 mL/min/{1.73_m2} (ref >59)
GFR calc non Af Amer: 59 mL/min/{1.73_m2} — ABNORMAL LOW (ref >59)
Globulin, Total: 2.4 g/dL (ref 1.5–4.5)
Glucose: 127 mg/dL — ABNORMAL HIGH (ref 65–99)
Potassium: 4.6 mmol/L (ref 3.5–5.2)
Sodium: 138 mmol/L (ref 134–144)
Total Protein: 7 g/dL (ref 6.0–8.5)

## 2017-07-07 LAB — LIPID PANEL
Chol/HDL Ratio: 3.8 ratio (ref 0.0–4.4)
Cholesterol, Total: 179 mg/dL (ref 100–199)
HDL: 47 mg/dL (ref >39)
LDL Calculated: 94 mg/dL (ref 0–99)
Triglycerides: 189 mg/dL — ABNORMAL HIGH (ref 0–149)
VLDL Cholesterol Cal: 38 mg/dL (ref 5–40)

## 2017-07-08 ENCOUNTER — Other Ambulatory Visit: Payer: Self-pay

## 2017-07-08 DIAGNOSIS — I10 Essential (primary) hypertension: Secondary | ICD-10-CM

## 2017-07-08 MED ORDER — METFORMIN HCL 500 MG PO TABS: 500.0000 mg | ORAL_TABLET | Freq: Two times a day (BID) | ORAL | 0 refills | Status: DC

## 2017-07-08 MED ORDER — HYDROCHLOROTHIAZIDE 25 MG PO TABS: 25.0000 mg | ORAL_TABLET | Freq: Every day | ORAL | 1 refills | Status: DC

## 2017-07-08 MED ORDER — FENOFIBRATE 160 MG PO TABS: 160.0000 mg | ORAL_TABLET | Freq: Every day | ORAL | 0 refills | Status: DC

## 2017-08-28 ENCOUNTER — Other Ambulatory Visit: Payer: Self-pay | Admitting: Nurse Practitioner

## 2017-11-22 ENCOUNTER — Other Ambulatory Visit: Payer: Self-pay | Admitting: Nurse Practitioner

## 2017-11-24 ENCOUNTER — Other Ambulatory Visit: Payer: Self-pay | Admitting: Nurse Practitioner

## 2018-02-26 ENCOUNTER — Other Ambulatory Visit: Payer: Self-pay | Admitting: Nurse Practitioner

## 2018-03-27 ENCOUNTER — Other Ambulatory Visit: Payer: Self-pay | Admitting: Nurse Practitioner

## 2018-03-27 DIAGNOSIS — I10 Essential (primary) hypertension: Secondary | ICD-10-CM

## 2018-03-28 NOTE — Telephone Encounter (Signed)
Last refill without being seen 

## 2018-03-28 NOTE — Telephone Encounter (Signed)
Last seen 07/06/17  MMM 

## 2018-03-30 ENCOUNTER — Other Ambulatory Visit: Payer: Self-pay | Admitting: Nurse Practitioner

## 2018-03-30 DIAGNOSIS — E785 Hyperlipidemia, unspecified: Secondary | ICD-10-CM

## 2018-03-31 NOTE — Telephone Encounter (Signed)
Last refill without being seen 

## 2018-05-02 ENCOUNTER — Other Ambulatory Visit: Payer: Self-pay | Admitting: *Deleted

## 2018-05-02 MED ORDER — GLUCOSE BLOOD VI STRP: ORAL_STRIP | 0 refills | Status: DC

## 2018-05-19 ENCOUNTER — Other Ambulatory Visit: Payer: Self-pay | Admitting: Nurse Practitioner

## 2018-05-21 ENCOUNTER — Other Ambulatory Visit: Payer: Self-pay | Admitting: Nurse Practitioner

## 2018-05-29 ENCOUNTER — Other Ambulatory Visit: Payer: Self-pay | Admitting: Nurse Practitioner

## 2018-06-09 ENCOUNTER — Encounter: Payer: Self-pay | Admitting: Nurse Practitioner

## 2018-06-09 ENCOUNTER — Ambulatory Visit: Payer: BLUE CROSS/BLUE SHIELD | Admitting: Nurse Practitioner

## 2018-06-09 VITALS — BP 154/86 | HR 75 | Temp 98.3°F | Ht 65.0 in | Wt 210.0 lb

## 2018-06-09 DIAGNOSIS — Z1212 Encounter for screening for malignant neoplasm of rectum: Secondary | ICD-10-CM | POA: Diagnosis not present

## 2018-06-09 DIAGNOSIS — Z13820 Encounter for screening for osteoporosis: Secondary | ICD-10-CM

## 2018-06-09 DIAGNOSIS — Z6837 Body mass index (BMI) 37.0-37.9, adult: Secondary | ICD-10-CM | POA: Diagnosis not present

## 2018-06-09 DIAGNOSIS — Z1211 Encounter for screening for malignant neoplasm of colon: Secondary | ICD-10-CM

## 2018-06-09 DIAGNOSIS — I10 Essential (primary) hypertension: Secondary | ICD-10-CM

## 2018-06-09 DIAGNOSIS — E119 Type 2 diabetes mellitus without complications: Secondary | ICD-10-CM | POA: Diagnosis not present

## 2018-06-09 DIAGNOSIS — D519 Vitamin B12 deficiency anemia, unspecified: Secondary | ICD-10-CM

## 2018-06-09 DIAGNOSIS — E785 Hyperlipidemia, unspecified: Secondary | ICD-10-CM

## 2018-06-09 DIAGNOSIS — Z23 Encounter for immunization: Secondary | ICD-10-CM

## 2018-06-09 DIAGNOSIS — R6889 Other general symptoms and signs: Secondary | ICD-10-CM | POA: Diagnosis not present

## 2018-06-09 LAB — BAYER DCA HB A1C WAIVED: HB A1C (BAYER DCA - WAIVED): 8.3 % — ABNORMAL HIGH (ref <7.0)

## 2018-06-09 MED ORDER — METFORMIN HCL 500 MG PO TABS: 500.0000 mg | ORAL_TABLET | Freq: Two times a day (BID) | ORAL | 1 refills | Status: DC

## 2018-06-09 MED ORDER — LISINOPRIL 40 MG PO TABS: 40.0000 mg | ORAL_TABLET | Freq: Every day | ORAL | 1 refills | Status: DC

## 2018-06-09 MED ORDER — ATORVASTATIN CALCIUM 40 MG PO TABS: 40.0000 mg | ORAL_TABLET | Freq: Every day | ORAL | 1 refills | Status: DC

## 2018-06-09 MED ORDER — FENOFIBRATE 160 MG PO TABS: 160.0000 mg | ORAL_TABLET | Freq: Every day | ORAL | 1 refills | Status: DC

## 2018-06-09 MED ORDER — HYDROCHLOROTHIAZIDE 25 MG PO TABS: 25.0000 mg | ORAL_TABLET | Freq: Every day | ORAL | 1 refills | Status: DC

## 2018-06-09 NOTE — Addendum Note (Signed)
Addended by: Cleda DaubUCKER, Tiann Saha G on: 06/09/2018 05:09 PM   Modules accepted: Orders

## 2018-06-09 NOTE — Progress Notes (Signed)
Subjective:    Patient ID: Emma Nunez, female    DOB: July 26, 1951, 67 y.o.   MRN: [id number]   Chief Complaint: Medical Management of Chronic Issues   HPI:  1. Type 2 diabetes mellitus without complication, without long-term current use of insulin (HCC) last HGBA1c was 7.0. Has not been checking blood sugars at home.  2. Essential hypertension, benign  No c/o chest pain, sob or headache. Does not check blood pressures at home. BP Readings from Last 3 Encounters:  06/09/18 (!) 153/89  07/06/17 132/86  04/29/17 138/82     3. Hyperlipidemia with target LDL less than 100  Does not watch diet. Does very little exercise.  4. BMI 37.0-37.9, adult  No recent weight changes. Very concerned about this- would like referral to weight management  5. Anemia due to vitamin B12 deficiency, unspecified B12 deficiency type  Has not Nunez injections for a couple of years.    Outpatient Encounter Medications as of 06/09/2018  Medication Sig  . atorvastatin (LIPITOR) 40 MG tablet TAKE 1 TABLET EVERY DAY MUST BE SEEN  . Blood Glucose Monitoring Suppl (CONTOUR NEXT MONITOR) w/Device KIT USE AS DIRECTED  . fenofibrate 160 MG tablet TAKE 1 TABLET BY MOUTH EVERY DAY  . fluticasone (FLONASE) 50 MCG/ACT nasal spray Place 2 sprays into both nostrils daily.  Marland Kitchen glucose blood test strip Contour test strips check BS BID  . hydrochlorothiazide (HYDRODIURIL) 25 MG tablet Take 1 tablet (25 mg total) by mouth daily.  Marland Kitchen LANCETS MICRO THIN 33G MISC Use to check BG once daily.  Dispense whichever goes with meter and that insurance will cover.  Marland Kitchen lisinopril (PRINIVIL,ZESTRIL) 40 MG tablet TAKE 1 TABLET BY MOUTH EVERY DAY  . metFORMIN (GLUCOPHAGE) 500 MG tablet TAKE 1 TABLET (500 MG TOTAL) BY MOUTH 2 (TWO) TIMES DAILY WITH A MEAL.   New complaints: None today  Social history: Lives by herself but has daughters that live nearby.   Review of Systems  Constitutional: Negative for activity change and appetite  change.  HENT: Negative.   Eyes: Negative for pain.  Respiratory: Negative for shortness of breath.   Cardiovascular: Negative for chest pain, palpitations and leg swelling.  Gastrointestinal: Negative for abdominal pain.  Endocrine: Negative for polydipsia.  Genitourinary: Negative.   Skin: Negative for rash.  Neurological: Negative for dizziness, weakness and headaches.  Hematological: Does not bruise/bleed easily.  Psychiatric/Behavioral: Negative.   All other systems reviewed and are negative.      Objective:   Physical Exam  Constitutional: She is oriented to person, place, and time. She appears well-developed and well-nourished. No distress.  HENT:  Head: Normocephalic.  Nose: Nose normal.  Mouth/Throat: Oropharynx is clear and moist.  Eyes: Pupils are equal, round, and reactive to light. EOM are normal.  Neck: Normal range of motion. Neck supple. No JVD present. Carotid bruit is not present.  Cardiovascular: Normal rate, regular rhythm, normal heart sounds and intact distal pulses.  Pulmonary/Chest: Effort normal and breath sounds normal. No respiratory distress. She has no wheezes. She has no rales. She exhibits no tenderness.  Abdominal: Soft. Normal appearance, normal aorta and bowel sounds are normal. She exhibits no distension, no abdominal bruit, no pulsatile midline mass and no mass. There is no splenomegaly or hepatomegaly. There is no tenderness.  Musculoskeletal: Normal range of motion. She exhibits no edema.  Lymphadenopathy:    She has no cervical adenopathy.  Neurological: She is alert and oriented to person, place, and time. She  has normal reflexes.  Skin: Skin is warm and dry.  Psychiatric: She has a normal mood and affect. Her behavior is normal. Judgment and thought content normal.   BP (!) 154/86   Pulse 75   Temp 98.3 F (36.8 C) (Oral)   Ht '5\' 5"'  (1.651 m)   Wt 210 lb (95.3 kg)   BMI 34.95 kg/m   hgba1c 8.3      Assessment & Plan:  Emma Nunez comes in today with chief complaint of Medical Management of Chronic Issues   Diagnosis and orders addressed:  1. Type 2 diabetes mellitus without complication, without long-term current use of insulin (HCC) Continue to watch carbs in diet Take meds as directed- patient wants to try to get hgba1c down on [place] - Bayer DCA Hb A1c Waived - Microalbumin / creatinine urine ratio  2. Essential hypertension, benign Low sodium diet - CMP14+EGFR - lisinopril (PRINIVIL,ZESTRIL) 40 MG tablet; Take 1 tablet (40 mg total) by mouth daily.  Dispense: 90 tablet; Refill: 1 - hydrochlorothiazide (HYDRODIURIL) 25 MG tablet; Take 1 tablet (25 mg total) by mouth daily.  Dispense: 90 tablet; Refill: 1  3. Hyperlipidemia with target LDL less than 100 Low fat diet - Lipid panel - atorvastatin (LIPITOR) 40 MG tablet; Take 1 tablet (40 mg total) by mouth daily at 6 PM.  Dispense: 90 tablet; Refill: 1  4. BMI 37.0-37.9, adult Discussed diet and exercise for person with BMI >25 Will recheck weight in 3-6 months - Amb Ref to Medical Weight Management - Vitamin B12  5. Anemia due to vitamin B12 deficiency, unspecified B12 deficiency type  6. Encounter for colorectal cancer screening - Cologuard  7. Screening for osteoporosis - DG [facility] DEXA   Labs pending Health Maintenance reviewed Diet and exercise encouraged  Follow up plan: 1 month - diabetes only   Emma Hassell Done, FNP

## 2018-06-09 NOTE — Patient Instructions (Signed)
Diabetes Mellitus and Nutrition When you have diabetes (diabetes mellitus), it is very important to have healthy eating habits because your blood sugar (glucose) levels are greatly affected by what you eat and drink. Eating healthy foods in the appropriate amounts, at about the same times every day, can help you:  Control your blood glucose.  Lower your risk of heart disease.  Improve your blood pressure.  Reach or maintain a healthy weight.  Every person with diabetes is different, and each person has different needs for a meal plan. Your health care provider may recommend that you work with a diet and nutrition specialist (dietitian) to make a meal plan that is best for you. Your meal plan may vary depending on factors such as:  The calories you need.  The medicines you take.  Your weight.  Your blood glucose, blood pressure, and cholesterol levels.  Your activity level.  Other health conditions you have, such as heart or kidney disease.  How do carbohydrates affect me? Carbohydrates affect your blood glucose level more than any other type of food. Eating carbohydrates naturally increases the amount of glucose in your blood. Carbohydrate counting is a method for keeping track of how many carbohydrates you eat. Counting carbohydrates is important to keep your blood glucose at a healthy level, especially if you use insulin or take certain oral diabetes medicines. It is important to know how many carbohydrates you can safely have in each meal. This is different for every person. Your dietitian can help you calculate how many carbohydrates you should have at each meal and for snack. Foods that contain carbohydrates include:  Bread, cereal, rice, pasta, and crackers.  Potatoes and corn.  Peas, beans, and lentils.  Milk and yogurt.  Fruit and juice.  Desserts, such as cakes, cookies, ice cream, and candy.  How does alcohol affect me? Alcohol can cause a sudden decrease in blood  glucose (hypoglycemia), especially if you use insulin or take certain oral diabetes medicines. Hypoglycemia can be a life-threatening condition. Symptoms of hypoglycemia (sleepiness, dizziness, and confusion) are similar to symptoms of having too much alcohol. If your health care provider says that alcohol is safe for you, follow these guidelines:  Limit alcohol intake to no more than 1 drink per day for nonpregnant women and 2 drinks per day for men. One drink equals 12 oz of beer, 5 oz of wine, or 1 oz of hard liquor.  Do not drink on an empty stomach.  Keep yourself hydrated with water, diet soda, or unsweetened iced tea.  Keep in mind that regular soda, juice, and other mixers may contain a lot of sugar and must be counted as carbohydrates.  What are tips for following this plan? Reading food labels  Start by checking the serving size on the label. The amount of calories, carbohydrates, fats, and other nutrients listed on the label are based on one serving of the food. Many foods contain more than one serving per package.  Check the total grams (g) of carbohydrates in one serving. You can calculate the number of servings of carbohydrates in one serving by dividing the total carbohydrates by 15. For example, if a food has 30 g of total carbohydrates, it would be equal to 2 servings of carbohydrates.  Check the number of grams (g) of saturated and trans fats in one serving. Choose foods that have low or no amount of these fats.  Check the number of milligrams (mg) of sodium in one serving. Most people   should limit total sodium intake to less than 2,300 mg per day.  Always check the nutrition information of foods labeled as "low-fat" or "nonfat". These foods may be higher in added sugar or refined carbohydrates and should be avoided.  Talk to your dietitian to identify your daily goals for nutrients listed on the label. Shopping  Avoid buying canned, premade, or processed foods. These  foods tend to be high in fat, sodium, and added sugar.  Shop around the outside edge of the grocery store. This includes fresh fruits and vegetables, bulk grains, fresh meats, and fresh dairy. Cooking  Use low-heat cooking methods, such as baking, instead of high-heat cooking methods like deep frying.  Cook using healthy oils, such as olive, canola, or sunflower oil.  Avoid cooking with butter, cream, or high-fat meats. Meal planning  Eat meals and snacks regularly, preferably at the same times every day. Avoid going long periods of time without eating.  Eat foods high in fiber, such as fresh fruits, vegetables, beans, and whole grains. Talk to your dietitian about how many servings of carbohydrates you can eat at each meal.  Eat 4-6 ounces of lean protein each day, such as lean meat, chicken, fish, eggs, or tofu. 1 ounce is equal to 1 ounce of meat, chicken, or fish, 1 egg, or 1/4 cup of tofu.  Eat some foods each day that contain healthy fats, such as avocado, nuts, seeds, and fish. Lifestyle   Check your blood glucose regularly.  Exercise at least 30 minutes 5 or more days each week, or as told by your health care provider.  Take medicines as told by your health care provider.  Do not use any products that contain nicotine or tobacco, such as cigarettes and e-cigarettes. If you need help quitting, ask your health care provider.  Work with a counselor or diabetes educator to identify strategies to manage stress and any emotional and social challenges. What are some questions to ask my health care provider?  Do I need to meet with a diabetes educator?  Do I need to meet with a dietitian?  What number can I call if I have questions?  When are the best times to check my blood glucose? Where to find more information:  American Diabetes Association: diabetes.org/food-and-fitness/food  Academy of Nutrition and Dietetics:  www.eatright.org/resources/health/diseases-and-conditions/diabetes  National Institute of Diabetes and Digestive and Kidney Diseases (NIH): www.niddk.nih.gov/health-information/diabetes/overview/diet-eating-physical-activity Summary  A healthy meal plan will help you control your blood glucose and maintain a healthy lifestyle.  Working with a diet and nutrition specialist (dietitian) can help you make a meal plan that is best for you.  Keep in mind that carbohydrates and alcohol have immediate effects on your blood glucose levels. It is important to count carbohydrates and to use alcohol carefully. This information is not intended to replace advice given to you by your health care provider. Make sure you discuss any questions you have with your health care provider. Document Released: 09/03/2005 Document Revised: 01/11/2017 Document Reviewed: 01/11/2017 Elsevier Interactive Patient Education  2018 [employer]  

## 2018-06-10 LAB — CMP14+EGFR
ALT: 34 IU/L — ABNORMAL HIGH (ref 0–32)
AST: 26 IU/L (ref 0–40)
Albumin/Globulin Ratio: 1.8 (ref 1.2–2.2)
Albumin: 4.6 g/dL (ref 3.6–4.8)
Alkaline Phosphatase: 92 IU/L (ref 39–117)
BUN/Creatinine Ratio: 17 (ref 12–28)
BUN: 15 mg/dL (ref 8–27)
Bilirubin Total: 0.3 mg/dL (ref 0.0–1.2)
CO2: 24 mmol/L (ref 20–29)
Calcium: 9.7 mg/dL (ref 8.7–10.3)
Chloride: 95 mmol/L — ABNORMAL LOW (ref 96–106)
Creatinine, Ser: 0.9 mg/dL (ref 0.57–1.00)
GFR calc Af Amer: 77 mL/min/{1.73_m2} (ref >59)
GFR calc non Af Amer: 67 mL/min/{1.73_m2} (ref >59)
Globulin, Total: 2.5 g/dL (ref 1.5–4.5)
Glucose: 242 mg/dL — ABNORMAL HIGH (ref 65–99)
Potassium: 4 mmol/L (ref 3.5–5.2)
Sodium: 140 mmol/L (ref 134–144)
Total Protein: 7.1 g/dL (ref 6.0–8.5)

## 2018-06-10 LAB — LIPID PANEL
Chol/HDL Ratio: 4.3 ratio (ref 0.0–4.4)
Cholesterol, Total: 197 mg/dL (ref 100–199)
HDL: 46 mg/dL (ref >39)
LDL Calculated: 113 mg/dL — ABNORMAL HIGH (ref 0–99)
Triglycerides: 188 mg/dL — ABNORMAL HIGH (ref 0–149)
VLDL Cholesterol Cal: 38 mg/dL (ref 5–40)

## 2018-06-10 LAB — VITAMIN B12: Vitamin B-12: 281 pg/mL (ref 232–1245)

## 2018-06-10 NOTE — Addendum Note (Signed)
Addended by: Cleda DaubUCKER, Kaia [place]aolis G on: 06/10/2018 09:24 AM   Modules accepted: Orders

## 2018-07-14 ENCOUNTER — Encounter: Payer: Self-pay | Admitting: Nurse Practitioner

## 2018-07-14 ENCOUNTER — Ambulatory Visit (INDEPENDENT_AMBULATORY_CARE_PROVIDER_SITE_OTHER): Payer: BLUE CROSS/BLUE SHIELD | Admitting: Nurse Practitioner

## 2018-07-14 VITALS — BP 115/66 | HR 72 | Temp 97.3°F | Ht 65.0 in | Wt 206.0 lb

## 2018-07-14 DIAGNOSIS — E119 Type 2 diabetes mellitus without complications: Secondary | ICD-10-CM

## 2018-07-14 LAB — BAYER DCA HB A1C WAIVED: HB A1C (BAYER DCA - WAIVED): 8.4 % — ABNORMAL HIGH (ref <7.0)

## 2018-07-14 MED ORDER — METFORMIN HCL 1000 MG PO TABS: 1000.0000 mg | ORAL_TABLET | Freq: Two times a day (BID) | ORAL | 1 refills | Status: DC

## 2018-07-14 NOTE — Progress Notes (Signed)
   Subjective:    Patient ID: Emma Nunez, female    DOB: 12-21-1951, 67 y.o.   MRN: [id number]   Chief Complaint: Recheck Hgb A1c   HPI Patient was seen for medical management of chronic issues on 06/09/18 and her hgba1c was 8.3%. She refused to change meds at last visit. She said that she wanted to try to get better on her own. She has been watching diet. Patient has not been checking blood sugars.she is currently on metformin 500mg  bid.   Review of Systems  Constitutional: Negative.   Respiratory: Negative.   Cardiovascular: Negative.   Genitourinary: Negative.   Neurological: Negative.   Psychiatric/Behavioral: Negative.   All other systems reviewed and are negative.      Objective:   Physical Exam  Constitutional: She is oriented to person, place, and time. She appears well-developed and well-nourished. No distress.  Cardiovascular: Normal rate and regular rhythm.  Pulmonary/Chest: Effort normal.  Neurological: She is alert and oriented to person, place, and time.  Skin: Skin is warm.  Psychiatric: She has a normal mood and affect. Her behavior is normal. Judgment and thought content normal.  Nursing note and vitals reviewed.  BP 115/66   Pulse 72   Temp (!) 97.3 F (36.3 C) (Oral)   Ht 5\' 5"  (1.651 m)   Wt 206 lb (93.4 kg)   BMI 34.28 kg/m   hjgba1c 8.4%      Assessment & Plan:  Emma Nunez in today with chief complaint of Recheck Hgb A1c   1. Type 2 diabetes mellitus without complication, without long-term current use of insulin (HCC) Stricter carb counting encourage daily exercise  follo wup in 3 months - Bayer DCA Hb A1c Waived - metFORMIN (GLUCOPHAGE) 1000 MG tablet; Take 1 tablet (1,000 mg total) by mouth 2 (two) times daily with a meal.  Dispense: 180 tablet; Refill: 1  Ladene-Margaret Daphine DeutscherMartin, FNP

## 2018-07-14 NOTE — Patient Instructions (Signed)
Diabetes Mellitus and Nutrition When you have diabetes (diabetes mellitus), it is very important to have healthy eating habits because your blood sugar (glucose) levels are greatly affected by what you eat and drink. Eating healthy foods in the appropriate amounts, at about the same times every day, can help you:  Control your blood glucose.  Lower your risk of heart disease.  Improve your blood pressure.  Reach or maintain a healthy weight.  Every person with diabetes is different, and each person has different needs for a meal plan. Your health care provider may recommend that you work with a diet and nutrition specialist (dietitian) to make a meal plan that is best for you. Your meal plan may vary depending on factors such as:  The calories you need.  The medicines you take.  Your weight.  Your blood glucose, blood pressure, and cholesterol levels.  Your activity level.  Other health conditions you have, such as heart or kidney disease.  How do carbohydrates affect me? Carbohydrates affect your blood glucose level more than any other type of food. Eating carbohydrates naturally increases the amount of glucose in your blood. Carbohydrate counting is a method for keeping track of how many carbohydrates you eat. Counting carbohydrates is important to keep your blood glucose at a healthy level, especially if you use insulin or take certain oral diabetes medicines. It is important to know how many carbohydrates you can safely have in each meal. This is different for every person. Your dietitian can help you calculate how many carbohydrates you should have at each meal and for snack. Foods that contain carbohydrates include:  Bread, cereal, rice, pasta, and crackers.  Potatoes and corn.  Peas, beans, and lentils.  Milk and yogurt.  Fruit and juice.  Desserts, such as cakes, cookies, ice cream, and candy.  How does alcohol affect me? Alcohol can cause a sudden decrease in blood  glucose (hypoglycemia), especially if you use insulin or take certain oral diabetes medicines. Hypoglycemia can be a life-threatening condition. Symptoms of hypoglycemia (sleepiness, dizziness, and confusion) are similar to symptoms of having too much alcohol. If your health care provider says that alcohol is safe for you, follow these guidelines:  Limit alcohol intake to no more than 1 drink per day for nonpregnant women and 2 drinks per day for men. One drink equals 12 oz of beer, 5 oz of wine, or 1 oz of hard liquor.  Do not drink on an empty stomach.  Keep yourself hydrated with water, diet soda, or unsweetened iced tea.  Keep in mind that regular soda, juice, and other mixers may contain a lot of sugar and must be counted as carbohydrates.  What are tips for following this plan? Reading food labels  Start by checking the serving size on the label. The amount of calories, carbohydrates, fats, and other nutrients listed on the label are based on one serving of the food. Many foods contain more than one serving per package.  Check the total grams (g) of carbohydrates in one serving. You can calculate the number of servings of carbohydrates in one serving by dividing the total carbohydrates by 15. For example, if a food has 30 g of total carbohydrates, it would be equal to 2 servings of carbohydrates.  Check the number of grams (g) of saturated and trans fats in one serving. Choose foods that have low or no amount of these fats.  Check the number of milligrams (mg) of sodium in one serving. Most people   should limit total sodium intake to less than 2,300 mg per day.  Always check the nutrition information of foods labeled as "low-fat" or "nonfat". These foods may be higher in added sugar or refined carbohydrates and should be avoided.  Talk to your dietitian to identify your daily goals for nutrients listed on the label. Shopping  Avoid buying canned, premade, or processed foods. These  foods tend to be high in fat, sodium, and added sugar.  Shop around the outside edge of the grocery store. This includes fresh fruits and vegetables, bulk grains, fresh meats, and fresh dairy. Cooking  Use low-heat cooking methods, such as baking, instead of high-heat cooking methods like deep frying.  Cook using healthy oils, such as olive, canola, or sunflower oil.  Avoid cooking with butter, cream, or high-fat meats. Meal planning  Eat meals and snacks regularly, preferably at the same times every day. Avoid going long periods of time without eating.  Eat foods high in fiber, such as fresh fruits, vegetables, beans, and whole grains. Talk to your dietitian about how many servings of carbohydrates you can eat at each meal.  Eat 4-6 ounces of lean protein each day, such as lean meat, chicken, fish, eggs, or tofu. 1 ounce is equal to 1 ounce of meat, chicken, or fish, 1 egg, or 1/4 cup of tofu.  Eat some foods each day that contain healthy fats, such as avocado, nuts, seeds, and fish. Lifestyle   Check your blood glucose regularly.  Exercise at least 30 minutes 5 or more days each week, or as told by your health care provider.  Take medicines as told by your health care provider.  Do not use any products that contain nicotine or tobacco, such as cigarettes and e-cigarettes. If you need help quitting, ask your health care provider.  Work with a counselor or diabetes educator to identify strategies to manage stress and any emotional and social challenges. What are some questions to ask my health care provider?  Do I need to meet with a diabetes educator?  Do I need to meet with a dietitian?  What number can I call if I have questions?  When are the best times to check my blood glucose? Where to find more information:  American Diabetes Association: diabetes.org/food-and-fitness/food  Academy of Nutrition and Dietetics:  www.eatright.org/resources/health/diseases-and-conditions/diabetes  National Institute of Diabetes and Digestive and Kidney Diseases (NIH): www.niddk.nih.gov/health-information/diabetes/overview/diet-eating-physical-activity Summary  A healthy meal plan will help you control your blood glucose and maintain a healthy lifestyle.  Working with a diet and nutrition specialist (dietitian) can help you make a meal plan that is best for you.  Keep in mind that carbohydrates and alcohol have immediate effects on your blood glucose levels. It is important to count carbohydrates and to use alcohol carefully. This information is not intended to replace advice given to you by your health care provider. Make sure you discuss any questions you have with your health care provider. Document Released: 09/03/2005 Document Revised: 01/11/2017 Document Reviewed: 01/11/2017 Elsevier Interactive Patient Education  2018 [employer]  

## 2018-08-20 ENCOUNTER — Other Ambulatory Visit: Payer: Self-pay | Admitting: Nurse Practitioner

## 2018-10-17 ENCOUNTER — Ambulatory Visit (INDEPENDENT_AMBULATORY_CARE_PROVIDER_SITE_OTHER): Payer: BLUE CROSS/BLUE SHIELD | Admitting: Nurse Practitioner

## 2018-10-17 ENCOUNTER — Encounter: Payer: Self-pay | Admitting: Nurse Practitioner

## 2018-10-17 VITALS — BP 146/77 | HR 72 | Temp 97.0°F | Ht 65.0 in | Wt 208.0 lb

## 2018-10-17 DIAGNOSIS — I10 Essential (primary) hypertension: Secondary | ICD-10-CM

## 2018-10-17 DIAGNOSIS — Z23 Encounter for immunization: Secondary | ICD-10-CM

## 2018-10-17 DIAGNOSIS — E119 Type 2 diabetes mellitus without complications: Secondary | ICD-10-CM | POA: Diagnosis not present

## 2018-10-17 DIAGNOSIS — Z6838 Body mass index (BMI) 38.0-38.9, adult: Secondary | ICD-10-CM

## 2018-10-17 DIAGNOSIS — E785 Hyperlipidemia, unspecified: Secondary | ICD-10-CM | POA: Diagnosis not present

## 2018-10-17 DIAGNOSIS — D519 Vitamin B12 deficiency anemia, unspecified: Secondary | ICD-10-CM

## 2018-10-17 LAB — BAYER DCA HB A1C WAIVED: HB A1C (BAYER DCA - WAIVED): 7.4 % — ABNORMAL HIGH (ref <7.0)

## 2018-10-17 MED ORDER — HYDROCHLOROTHIAZIDE 25 MG PO TABS: 25.0000 mg | ORAL_TABLET | Freq: Every day | ORAL | 1 refills | Status: DC

## 2018-10-17 MED ORDER — ATORVASTATIN CALCIUM 40 MG PO TABS: 40.0000 mg | ORAL_TABLET | Freq: Every day | ORAL | 1 refills | Status: DC

## 2018-10-17 MED ORDER — FENOFIBRATE 160 MG PO TABS: 160.0000 mg | ORAL_TABLET | Freq: Every day | ORAL | 1 refills | Status: DC

## 2018-10-17 MED ORDER — METFORMIN HCL 1000 MG PO TABS: 1000.0000 mg | ORAL_TABLET | Freq: Two times a day (BID) | ORAL | 1 refills | Status: DC

## 2018-10-17 MED ORDER — LISINOPRIL 40 MG PO TABS: 40.0000 mg | ORAL_TABLET | Freq: Every day | ORAL | 1 refills | Status: DC

## 2018-10-17 NOTE — Patient Instructions (Signed)
Diabetes Mellitus and Nutrition When you have diabetes (diabetes mellitus), it is very important to have healthy eating habits because your blood sugar (glucose) levels are greatly affected by what you eat and drink. Eating healthy foods in the appropriate amounts, at about the same times every day, can help you:  Control your blood glucose.  Lower your risk of heart disease.  Improve your blood pressure.  Reach or maintain a healthy weight.  Every person with diabetes is different, and each person has different needs for a meal plan. Your health care provider may recommend that you work with a diet and nutrition specialist (dietitian) to make a meal plan that is best for you. Your meal plan may vary depending on factors such as:  The calories you need.  The medicines you take.  Your weight.  Your blood glucose, blood pressure, and cholesterol levels.  Your activity level.  Other health conditions you have, such as heart or kidney disease.  How do carbohydrates affect me? Carbohydrates affect your blood glucose level more than any other type of food. Eating carbohydrates naturally increases the amount of glucose in your blood. Carbohydrate counting is a method for keeping track of how many carbohydrates you eat. Counting carbohydrates is important to keep your blood glucose at a healthy level, especially if you use insulin or take certain oral diabetes medicines. It is important to know how many carbohydrates you can safely have in each meal. This is different for every person. Your dietitian can help you calculate how many carbohydrates you should have at each meal and for snack. Foods that contain carbohydrates include:  Bread, cereal, rice, pasta, and crackers.  Potatoes and corn.  Peas, beans, and lentils.  Milk and yogurt.  Fruit and juice.  Desserts, such as cakes, cookies, ice cream, and candy.  How does alcohol affect me? Alcohol can cause a sudden decrease in blood  glucose (hypoglycemia), especially if you use insulin or take certain oral diabetes medicines. Hypoglycemia can be a life-threatening condition. Symptoms of hypoglycemia (sleepiness, dizziness, and confusion) are similar to symptoms of having too much alcohol. If your health care provider says that alcohol is safe for you, follow these guidelines:  Limit alcohol intake to no more than 1 drink per day for nonpregnant women and 2 drinks per day for men. One drink equals 12 oz of beer, 5 oz of wine, or 1 oz of hard liquor.  Do not drink on an empty stomach.  Keep yourself hydrated with water, diet soda, or unsweetened iced tea.  Keep in mind that regular soda, juice, and other mixers may contain a lot of sugar and must be counted as carbohydrates.  What are tips for following this plan? Reading food labels  Start by checking the serving size on the label. The amount of calories, carbohydrates, fats, and other nutrients listed on the label are based on one serving of the food. Many foods contain more than one serving per package.  Check the total grams (g) of carbohydrates in one serving. You can calculate the number of servings of carbohydrates in one serving by dividing the total carbohydrates by 15. For example, if a food has 30 g of total carbohydrates, it would be equal to 2 servings of carbohydrates.  Check the number of grams (g) of saturated and trans fats in one serving. Choose foods that have low or no amount of these fats.  Check the number of milligrams (mg) of sodium in one serving. Most people   should limit total sodium intake to less than 2,300 mg per day.  Always check the nutrition information of foods labeled as "low-fat" or "nonfat". These foods may be higher in added sugar or refined carbohydrates and should be avoided.  Talk to your dietitian to identify your daily goals for nutrients listed on the label. Shopping  Avoid buying canned, premade, or processed foods. These  foods tend to be high in fat, sodium, and added sugar.  Shop around the outside edge of the grocery store. This includes fresh fruits and vegetables, bulk grains, fresh meats, and fresh dairy. Cooking  Use low-heat cooking methods, such as baking, instead of high-heat cooking methods like deep frying.  Cook using healthy oils, such as olive, canola, or sunflower oil.  Avoid cooking with butter, cream, or high-fat meats. Meal planning  Eat meals and snacks regularly, preferably at the same times every day. Avoid going long periods of time without eating.  Eat foods high in fiber, such as fresh fruits, vegetables, beans, and whole grains. Talk to your dietitian about how many servings of carbohydrates you can eat at each meal.  Eat 4-6 ounces of lean protein each day, such as lean meat, chicken, fish, eggs, or tofu. 1 ounce is equal to 1 ounce of meat, chicken, or fish, 1 egg, or 1/4 cup of tofu.  Eat some foods each day that contain healthy fats, such as avocado, nuts, seeds, and fish. Lifestyle   Check your blood glucose regularly.  Exercise at least 30 minutes 5 or more days each week, or as told by your health care provider.  Take medicines as told by your health care provider.  Do not use any products that contain nicotine or tobacco, such as cigarettes and e-cigarettes. If you need help quitting, ask your health care provider.  Work with a counselor or diabetes educator to identify strategies to manage stress and any emotional and social challenges. What are some questions to ask my health care provider?  Do I need to meet with a diabetes educator?  Do I need to meet with a dietitian?  What number can I call if I have questions?  When are the best times to check my blood glucose? Where to find more information:  American Diabetes Association: diabetes.org/food-and-fitness/food  Academy of Nutrition and Dietetics:  www.eatright.org/resources/health/diseases-and-conditions/diabetes  National Institute of Diabetes and Digestive and Kidney Diseases (NIH): www.niddk.nih.gov/health-information/diabetes/overview/diet-eating-physical-activity Summary  A healthy meal plan will help you control your blood glucose and maintain a healthy lifestyle.  Working with a diet and nutrition specialist (dietitian) can help you make a meal plan that is best for you.  Keep in mind that carbohydrates and alcohol have immediate effects on your blood glucose levels. It is important to count carbohydrates and to use alcohol carefully. This information is not intended to replace advice given to you by your health care provider. Make sure you discuss any questions you have with your health care provider. Document Released: 09/03/2005 Document Revised: 01/11/2017 Document Reviewed: 01/11/2017 Elsevier Interactive Patient Education  2018 [employer]  

## 2018-10-17 NOTE — Progress Notes (Signed)
Subjective:    Patient ID: Emma Nunez, female    DOB: [birth year], 67 y.o.   MRN: [id number]   Chief Complaint: medical management of chronic issues  HPI:  1. Type 2 diabetes mellitus without complication, without long-term current use of insulin ([place]) last hgba1c was 8.4%. Increased metformin to 1054m bid at last visit. Blood sugars running above 150 most of time but below 200. She has not been watching diet, she admits.  2. Essential hypertension, benign  no c/o chest pain, sob or headache. Does not check blood pressure at home. BP Readings from Last 3 Encounters:  07/14/18 115/66  06/09/18 (!) 154/86  07/06/17 132/86     3. Hyperlipidemia with target LDL less than 100  Does not really watch diet very closely. Does no exercise  4. Anemia due to vitamin B12 deficiency, unspecified B12 deficiency type  denies fatigue. stopped B12 injection monthly. Will check levels today  5. BMI 37.0-37.9, adult  Weight up 2 lbs    Outpatient Encounter Medications as of 10/17/2018  Medication Sig  . atorvastatin (LIPITOR) 40 MG tablet Take 1 tablet (40 mg total) by mouth daily at 6 PM.  . Blood Glucose Monitoring Suppl (CONTOUR NEXT MONITOR) w/Device KIT USE AS DIRECTED  . CONTOUR NEXT TEST test strip USE TO CHECK BLOOD SUGAR TWICE DAILY  . fenofibrate 160 MG tablet Take 1 tablet (160 mg total) by mouth daily.  . fluticasone (FLONASE) 50 MCG/ACT nasal spray Place 2 sprays into both nostrils daily.  . hydrochlorothiazide (HYDRODIURIL) 25 MG tablet Take 1 tablet (25 mg total) by mouth daily.  .Marland KitchenLANCETS MICRO THIN 33G MISC Use to check BG once daily.  Dispense whichever goes with meter and that insurance will cover.  .Marland Kitchenlisinopril (PRINIVIL,ZESTRIL) 40 MG tablet Take 1 tablet (40 mg total) by mouth daily.  . metFORMIN (GLUCOPHAGE) 1000 MG tablet Take 1 tablet (1,000 mg total) by mouth 2 (two) times daily with a meal.     New complaints: None today  Social history: Lives near her daughters  by herself    Review of Systems  Constitutional: Negative for activity change and appetite change.  HENT: Negative.   Eyes: Negative for pain.  Respiratory: Negative for shortness of breath.   Cardiovascular: Negative for chest pain, palpitations and leg swelling.  Gastrointestinal: Negative for abdominal pain.  Endocrine: Negative for polydipsia.  Genitourinary: Negative.   Skin: Negative for rash.  Neurological: Negative for dizziness, weakness and headaches.  Hematological: Does not bruise/bleed easily.  Psychiatric/Behavioral: Negative.   All other systems reviewed and are negative.      Objective:   Physical Exam  Constitutional: She is oriented to person, place, and time. She appears well-developed and well-nourished. No distress.  HENT:  Head: Normocephalic.  Nose: Nose normal.  Mouth/Throat: Oropharynx is clear and moist.  Eyes: Pupils are equal, round, and reactive to light. EOM are normal.  Neck: Normal range of motion. Neck supple. No JVD present. Carotid bruit is not present.  Cardiovascular: Normal rate, regular rhythm, normal heart sounds and intact distal pulses.  Pulmonary/Chest: Effort normal and breath sounds normal. No respiratory distress. She has no wheezes. She has no rales. She exhibits no tenderness.  Abdominal: Soft. Normal appearance, normal aorta and bowel sounds are normal. She exhibits no distension, no abdominal bruit, no pulsatile midline mass and no mass. There is no splenomegaly or hepatomegaly. There is no tenderness.  Musculoskeletal: Normal range of motion. She exhibits no edema.  Lymphadenopathy:  She has no cervical adenopathy.  Neurological: She is alert and oriented to person, place, and time. She has normal reflexes.  Skin: Skin is warm and dry.  Psychiatric: She has a normal mood and affect. Her behavior is normal. Judgment and thought content normal.  Nursing note and vitals reviewed.  BP (!) 147/78   Pulse 72   Temp (!) 97 F  (36.1 C) (Oral)   Ht '5\' 5"'  (1.651 m)   Wt 208 lb (94.3 kg)   BMI 34.61 kg/m   hgba1c 7.4%     Assessment & Plan:  [name] comes in today with chief complaint of Medical Management of Chronic Issues   Diagnosis and orders addressed:  1. Type 2 diabetes mellitus without complication, without long-term current use of insulin (HCC) Stricter carb counting exerrcise encouraged - Bayer DCA Hb A1c Waived - metFORMIN (GLUCOPHAGE) 1000 MG tablet; Take 1 tablet (1,000 mg total) by mouth 2 (two) times daily with a meal.  Dispense: 180 tablet; Refill: 1  2. Essential hypertension, benign Low sodium diet - CMP14+EGFR - lisinopril (PRINIVIL,ZESTRIL) 40 MG tablet; Take 1 tablet (40 mg total) by mouth daily.  Dispense: 90 tablet; Refill: 1 - hydrochlorothiazide (HYDRODIURIL) 25 MG tablet; Take 1 tablet (25 mg total) by mouth daily.  Dispense: 90 tablet; Refill: 1  3. Hyperlipidemia with target LDL less than 100 Low fat diet - Lipid panel - atorvastatin (LIPITOR) 40 MG tablet; Take 1 tablet (40 mg total) by mouth daily at 6 PM.  Dispense: 90 tablet; Refill: 1  4. Anemia due to vitamin B12 deficiency, unspecified B12 deficiency type Checking labs today - Vitamin B12  5. BMI 38.0-38.9,adult Discussed diet and exercise for person with BMI >25 Will recheck weight in 3-6 months  * encouraged to o cologuard that was ordered at last visit Labs pending Health Maintenance reviewed Diet and exercise encouraged  Follow up plan: 3 months   [place], FNP

## 2018-10-18 LAB — CMP14+EGFR
ALT: 31 IU/L (ref 0–32)
AST: 32 IU/L (ref 0–40)
Albumin/Globulin Ratio: 2.3 — ABNORMAL HIGH (ref 1.2–2.2)
Albumin: 4.5 g/dL (ref 3.6–4.8)
Alkaline Phosphatase: 94 IU/L (ref 39–117)
BUN/Creatinine Ratio: 16 (ref 12–28)
BUN: 14 mg/dL (ref 8–27)
Bilirubin Total: 0.3 mg/dL (ref 0.0–1.2)
CO2: 21 mmol/L (ref 20–29)
Calcium: 9.5 mg/dL (ref 8.7–10.3)
Chloride: 95 mmol/L — ABNORMAL LOW (ref 96–106)
Creatinine, Ser: 0.85 mg/dL (ref 0.57–1.00)
GFR calc Af Amer: 82 mL/min/{1.73_m2} (ref >59)
GFR calc non Af Amer: 71 mL/min/{1.73_m2} (ref >59)
Globulin, Total: 2 g/dL (ref 1.5–4.5)
Glucose: 199 mg/dL — ABNORMAL HIGH (ref 65–99)
Potassium: 4 mmol/L (ref 3.5–5.2)
Sodium: 137 mmol/L (ref 134–144)
Total Protein: 6.5 g/dL (ref 6.0–8.5)

## 2018-10-18 LAB — LIPID PANEL
Chol/HDL Ratio: 3.8 ratio (ref 0.0–4.4)
Cholesterol, Total: 172 mg/dL (ref 100–199)
HDL: 45 mg/dL (ref >39)
LDL Calculated: 88 mg/dL (ref 0–99)
Triglycerides: 195 mg/dL — ABNORMAL HIGH (ref 0–149)
VLDL Cholesterol Cal: 39 mg/dL (ref 5–40)

## 2018-10-18 LAB — VITAMIN B12: Vitamin B-12: 222 pg/mL — ABNORMAL LOW (ref 232–1245)

## 2019-01-17 ENCOUNTER — Ambulatory Visit: Payer: Medicare Other | Admitting: Nurse Practitioner

## 2019-02-21 ENCOUNTER — Encounter: Payer: Self-pay | Admitting: Nurse Practitioner

## 2019-02-21 ENCOUNTER — Ambulatory Visit (INDEPENDENT_AMBULATORY_CARE_PROVIDER_SITE_OTHER): Payer: BLUE CROSS/BLUE SHIELD | Admitting: Nurse Practitioner

## 2019-02-21 VITALS — BP 148/74 | HR 71 | Temp 97.2°F | Ht 65.0 in | Wt 208.0 lb

## 2019-02-21 DIAGNOSIS — E538 Deficiency of other specified B group vitamins: Secondary | ICD-10-CM | POA: Diagnosis not present

## 2019-02-21 DIAGNOSIS — Z6837 Body mass index (BMI) 37.0-37.9, adult: Secondary | ICD-10-CM | POA: Diagnosis not present

## 2019-02-21 DIAGNOSIS — E119 Type 2 diabetes mellitus without complications: Secondary | ICD-10-CM | POA: Diagnosis not present

## 2019-02-21 DIAGNOSIS — E785 Hyperlipidemia, unspecified: Secondary | ICD-10-CM

## 2019-02-21 DIAGNOSIS — I10 Essential (primary) hypertension: Secondary | ICD-10-CM | POA: Diagnosis not present

## 2019-02-21 DIAGNOSIS — D519 Vitamin B12 deficiency anemia, unspecified: Secondary | ICD-10-CM

## 2019-02-21 LAB — BAYER DCA HB A1C WAIVED: HB A1C (BAYER DCA - WAIVED): 7.6 % — ABNORMAL HIGH (ref <7.0)

## 2019-02-21 MED ORDER — FENOFIBRATE 160 MG PO TABS: 160.0000 mg | ORAL_TABLET | Freq: Every day | ORAL | 1 refills | Status: DC

## 2019-02-21 MED ORDER — ATORVASTATIN CALCIUM 40 MG PO TABS: 40.0000 mg | ORAL_TABLET | Freq: Every day | ORAL | 1 refills | Status: DC

## 2019-02-21 MED ORDER — LISINOPRIL 40 MG PO TABS: 40.0000 mg | ORAL_TABLET | Freq: Every day | ORAL | 1 refills | Status: DC

## 2019-02-21 MED ORDER — HYDROCHLOROTHIAZIDE 25 MG PO TABS: 25.0000 mg | ORAL_TABLET | Freq: Every day | ORAL | 1 refills | Status: DC

## 2019-02-21 MED ORDER — METFORMIN HCL 1000 MG PO TABS: 1000.0000 mg | ORAL_TABLET | Freq: Two times a day (BID) | ORAL | 1 refills | Status: DC

## 2019-02-21 NOTE — Progress Notes (Signed)
Subjective:    Patient ID: Emma Nunez, female    DOB: 02/11/1951, 68 y.o.   MRN: [id number]   Chief Complaint: medial management of chronic issues  HPI:  1. Type 2 diabetes mellitus without complication, without long-term current use of insulin ([place]) last hgba1c was 7.4%. her fasting blood sugars are running 150-160.  2. Essential hypertension, benign  No c/o chest pain, sob or headache. Does not check blood pressure at home. BP Readings from Last 3 Encounters:  10/17/18 (!) 146/77  07/14/18 115/66  06/09/18 (!) 154/86     3. Hyperlipidemia with target LDL less than 100  Does not watch diet and does no exercise  4. Anemia due to vitamin B12 deficiency, unspecified B12 deficiency type  last b12 was 222- she was suppose to start b12 injections. She did not start he rinjections.  5. BMI 37.0-37.9, adult  No recent weight changes    Outpatient Encounter Medications as of 02/21/2019  Medication Sig  . atorvastatin (LIPITOR) 40 MG tablet Take 1 tablet (40 mg total) by mouth daily at 6 PM.  . Blood Glucose Monitoring Suppl (CONTOUR NEXT MONITOR) w/Device KIT USE AS DIRECTED  . CONTOUR NEXT TEST test strip USE TO CHECK BLOOD SUGAR TWICE DAILY  . fenofibrate 160 MG tablet Take 1 tablet (160 mg total) by mouth daily.  . fluticasone (FLONASE) 50 MCG/ACT nasal spray Place 2 sprays into both nostrils daily.  . hydrochlorothiazide (HYDRODIURIL) 25 MG tablet Take 1 tablet (25 mg total) by mouth daily.  Marland Kitchen LANCETS MICRO THIN 33G MISC Use to check BG once daily.  Dispense whichever goes with meter and that insurance will cover.  Marland Kitchen lisinopril (PRINIVIL,ZESTRIL) 40 MG tablet Take 1 tablet (40 mg total) by mouth daily.  . metFORMIN (GLUCOPHAGE) 1000 MG tablet Take 1 tablet (1,000 mg total) by mouth 2 (two) times daily with a meal.      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   Review of Systems    Constitutional: Negative for activity change and appetite change.  HENT: Negative.   Eyes: Negative for pain.  Respiratory: Negative for shortness of breath.   Cardiovascular: Negative for chest pain, palpitations and leg swelling.  Gastrointestinal: Negative for abdominal pain.  Endocrine: Negative for polydipsia.  Genitourinary: Negative.   Skin: Negative for rash.  Neurological: Negative for dizziness, weakness and headaches.  Hematological: Does not bruise/bleed easily.  Psychiatric/Behavioral: Negative.   All other systems reviewed and are negative.      Objective:   Physical Exam Vitals signs and nursing note reviewed.  Constitutional:      General: She is not in acute distress.    Appearance: Normal appearance. She is well-developed.  HENT:     Head: Normocephalic.     Nose: Nose normal.  Eyes:     Pupils: Pupils are equal, round, and reactive to light.  Neck:     Musculoskeletal: Normal range of motion and neck supple.     Vascular: No carotid bruit or JVD.  Cardiovascular:     Rate and Rhythm: Normal rate and regular rhythm.     Heart sounds: Normal heart sounds.  Pulmonary:     Effort: Pulmonary effort is normal. No respiratory distress.     Breath sounds: Normal breath sounds. No wheezing or rales.  Chest:     Chest wall: No tenderness.  Abdominal:     General: Bowel sounds  are normal. There is no distension or abdominal bruit.     Palpations: Abdomen is soft. There is no hepatomegaly, splenomegaly, mass or pulsatile mass.     Tenderness: There is no abdominal tenderness.  Musculoskeletal: Normal range of motion.  Lymphadenopathy:     Cervical: No cervical adenopathy.  Skin:    General: Skin is warm and dry.  Neurological:     Mental Status: She is alert and oriented to person, place, and time.     Deep Tendon Reflexes: Reflexes are normal and symmetric.  Psychiatric:        Behavior: Behavior normal.        Thought Content: Thought content normal.         Judgment: Judgment normal.    BP (!) 148/74 (BP Location: Left Arm, Cuff Size: Normal)   Pulse 71   Temp (!) 97.2 F (36.2 C) (Oral)   Ht '5\' 5"'  (1.651 m)   Wt 208 lb (94.3 kg)   BMI 34.61 kg/m    hgba1c 7.6     Assessment & Plan:  [name] comes in today with chief complaint of Medical Management of Chronic Issues   Diagnosis and orders addressed:  1. Type 2 diabetes mellitus without complication, without long-term current use of insulin (HCC) Low carb diet - Bayer DCA Hb A1c Waived - metFORMIN (GLUCOPHAGE) 1000 MG tablet; Take 1 tablet (1,000 mg total) by mouth 2 (two) times daily with a meal.  Dispense: 180 tablet; Refill: 1  2. Essential hypertension, benign Low sodium diet - lisinopril (PRINIVIL,ZESTRIL) 40 MG tablet; Take 1 tablet (40 mg total) by mouth daily.  Dispense: 90 tablet; Refill: 1 - hydrochlorothiazide (HYDRODIURIL) 25 MG tablet; Take 1 tablet (25 mg total) by mouth daily.  Dispense: 90 tablet; Refill: 1 - CMP14+EGFR  3. Hyperlipidemia with target LDL less than 100 Low fat diet - atorvastatin (LIPITOR) 40 MG tablet; Take 1 tablet (40 mg total) by mouth daily at 6 PM.  Dispense: 90 tablet; Refill: 1 - fenofibrate 160 MG tablet; Take 1 tablet (160 mg total) by mouth daily.  Dispense: 90 tablet; Refill: 1 - Lipid panel  4. Anemia due to vitamin B12 deficiency, unspecified B12 deficiency type Will start b12 shote today  5. BMI 37.0-37.9, adult Discussed diet and exercise for person with BMI >25 Will recheck weight in 3-6 months    Labs pending Health Maintenance reviewed Diet and exercise encouraged  Follow up plan: 3 months   Alianny-Margaret Hassell Done, FNP

## 2019-02-21 NOTE — Patient Instructions (Signed)
Carbohydrate Counting for Diabetes Mellitus, Adult  Carbohydrate counting is a method of keeping track of how many carbohydrates you eat. Eating carbohydrates naturally increases the amount of sugar (glucose) in the blood. Counting how many carbohydrates you eat helps keep your blood glucose within normal limits, which helps you manage your diabetes (diabetes mellitus). It is important to know how many carbohydrates you can safely have in each meal. This is different for every person. A diet and nutrition specialist (registered dietitian) can help you make a meal plan and calculate how many carbohydrates you should have at each meal and snack. Carbohydrates are found in the following foods:  Grains, such as breads and cereals.  Dried beans and soy products.  Starchy vegetables, such as potatoes, peas, and corn.  Fruit and fruit juices.  Milk and yogurt.  Sweets and snack foods, such as cake, cookies, candy, chips, and soft drinks. How do I count carbohydrates? There are two ways to count carbohydrates in food. You can use either of the methods or a combination of both. Reading "Nutrition Facts" on packaged food The "Nutrition Facts" list is included on the labels of almost all packaged foods and beverages in the [place] It includes:  The serving size.  Information about nutrients in each serving, including the grams (g) of carbohydrate per serving. To use the "Nutrition Facts":  Decide how many servings you will have.  Multiply the number of servings by the number of carbohydrates per serving.  The resulting number is the total amount of carbohydrates that you will be having. Learning standard serving sizes of other foods When you eat carbohydrate foods that are not packaged or do not include "Nutrition Facts" on the label, you need to measure the servings in order to count the amount of carbohydrates:  Measure the foods that you will eat with a food scale or measuring cup, if needed.   Decide how many standard-size servings you will eat.  Multiply the number of servings by 15. Most carbohydrate-rich foods have about 15 g of carbohydrates per serving. ? For example, if you eat 8 oz (170 g) of strawberries, you will have eaten 2 servings and 30 g of carbohydrates (2 servings x 15 g = 30 g).  For foods that have more than one food mixed, such as soups and casseroles, you must count the carbohydrates in each food that is included. The following list contains standard serving sizes of common carbohydrate-rich foods. Each of these servings has about 15 g of carbohydrates:   hamburger bun or  English muffin.   oz (15 mL) syrup.   oz (14 g) jelly.  1 slice of bread.  1 six-inch tortilla.  3 oz (85 g) cooked rice or pasta.  4 oz (113 g) cooked dried beans.  4 oz (113 g) starchy vegetable, such as peas, corn, or potatoes.  4 oz (113 g) hot cereal.  4 oz (113 g) mashed potatoes or  of a large baked potato.  4 oz (113 g) canned or frozen fruit.  4 oz (120 mL) fruit juice.  4-6 crackers.  6 chicken nuggets.  6 oz (170 g) unsweetened dry cereal.  6 oz (170 g) plain fat-free yogurt or yogurt sweetened with artificial sweeteners.  8 oz (240 mL) milk.  8 oz (170 g) fresh fruit or one small piece of fruit.  24 oz (680 g) popped popcorn. Example of carbohydrate counting Sample meal  3 oz (85 g) chicken breast.  6 oz (170 g)   brown rice.  4 oz (113 g) corn.  8 oz (240 mL) milk.  8 oz (170 g) strawberries with sugar-free whipped topping. Carbohydrate calculation 1. Identify the foods that contain carbohydrates: ? Rice. ? Corn. ? Milk. ? Strawberries. 2. Calculate how many servings you have of each food: ? 2 servings rice. ? 1 serving corn. ? 1 serving milk. ? 1 serving strawberries. 3. Multiply each number of servings by 15 g: ? 2 servings rice x 15 g = 30 g. ? 1 serving corn x 15 g = 15 g. ? 1 serving milk x 15 g = 15 g. ? 1 serving  strawberries x 15 g = 15 g. 4. Add together all of the amounts to find the total grams of carbohydrates eaten: ? 30 g + 15 g + 15 g + 15 g = 75 g of carbohydrates total. Summary  Carbohydrate counting is a method of keeping track of how many carbohydrates you eat.  Eating carbohydrates naturally increases the amount of sugar (glucose) in the blood.  Counting how many carbohydrates you eat helps keep your blood glucose within normal limits, which helps you manage your diabetes.  A diet and nutrition specialist (registered dietitian) can help you make a meal plan and calculate how many carbohydrates you should have at each meal and snack. This information is not intended to replace advice given to you by your health care provider. Make sure you discuss any questions you have with your health care provider. Document Released: 12/07/2005 Document Revised: 06/16/2017 Document Reviewed: 05/20/2016 Elsevier Interactive Patient Education  2019 Elsevier Inc.  

## 2019-02-22 LAB — LIPID PANEL
Chol/HDL Ratio: 3.9 ratio (ref 0.0–4.4)
Cholesterol, Total: 183 mg/dL (ref 100–199)
HDL: 47 mg/dL (ref >39)
LDL Calculated: 97 mg/dL (ref 0–99)
Triglycerides: 195 mg/dL — ABNORMAL HIGH (ref 0–149)
VLDL Cholesterol Cal: 39 mg/dL (ref 5–40)

## 2019-02-22 LAB — CMP14+EGFR
ALT: 41 IU/L — ABNORMAL HIGH (ref 0–32)
AST: 35 IU/L (ref 0–40)
Albumin/Globulin Ratio: 2.1 (ref 1.2–2.2)
Albumin: 4.7 g/dL (ref 3.8–4.8)
Alkaline Phosphatase: 89 IU/L (ref 39–117)
BUN/Creatinine Ratio: 13 (ref 12–28)
BUN: 11 mg/dL (ref 8–27)
Bilirubin Total: 0.3 mg/dL (ref 0.0–1.2)
CO2: 23 mmol/L (ref 20–29)
Calcium: 9.6 mg/dL (ref 8.7–10.3)
Chloride: 99 mmol/L (ref 96–106)
Creatinine, Ser: 0.85 mg/dL (ref 0.57–1.00)
GFR calc Af Amer: 82 mL/min/{1.73_m2} (ref >59)
GFR calc non Af Amer: 71 mL/min/{1.73_m2} (ref >59)
Globulin, Total: 2.2 g/dL (ref 1.5–4.5)
Glucose: 159 mg/dL — ABNORMAL HIGH (ref 65–99)
Potassium: 4.3 mmol/L (ref 3.5–5.2)
Sodium: 140 mmol/L (ref 134–144)
Total Protein: 6.9 g/dL (ref 6.0–8.5)

## 2019-02-28 ENCOUNTER — Ambulatory Visit (INDEPENDENT_AMBULATORY_CARE_PROVIDER_SITE_OTHER): Payer: BLUE CROSS/BLUE SHIELD | Admitting: *Deleted

## 2019-02-28 DIAGNOSIS — D519 Vitamin B12 deficiency anemia, unspecified: Secondary | ICD-10-CM

## 2019-02-28 MED ORDER — CYANOCOBALAMIN 1000 MCG/ML IJ SOLN
1000.0000 ug | INTRAMUSCULAR | Status: AC
  Administered 2019-02-28 – 2019-03-14 (×3): 1000 ug via INTRAMUSCULAR

## 2019-02-28 NOTE — Progress Notes (Signed)
Pt given Cyanocobalamin inj Tolerated well 

## 2019-03-07 ENCOUNTER — Ambulatory Visit: Payer: BLUE CROSS/BLUE SHIELD

## 2019-03-08 ENCOUNTER — Other Ambulatory Visit: Payer: Self-pay

## 2019-03-08 ENCOUNTER — Ambulatory Visit (INDEPENDENT_AMBULATORY_CARE_PROVIDER_SITE_OTHER): Payer: BLUE CROSS/BLUE SHIELD | Admitting: *Deleted

## 2019-03-08 DIAGNOSIS — D519 Vitamin B12 deficiency anemia, unspecified: Secondary | ICD-10-CM | POA: Diagnosis not present

## 2019-03-08 NOTE — Progress Notes (Signed)
Pt given cyanocobalamin inj Tolerated well 

## 2019-03-14 ENCOUNTER — Ambulatory Visit (INDEPENDENT_AMBULATORY_CARE_PROVIDER_SITE_OTHER): Payer: BLUE CROSS/BLUE SHIELD | Admitting: *Deleted

## 2019-03-14 ENCOUNTER — Other Ambulatory Visit: Payer: Self-pay

## 2019-03-14 DIAGNOSIS — D519 Vitamin B12 deficiency anemia, unspecified: Secondary | ICD-10-CM

## 2019-03-14 NOTE — Progress Notes (Signed)
Patient states she was to take Vitamin B12 injections weekly for 4 weeks- this was indicated on the medication list.  She states she was advised to come every other week for 2 injections, and then monthly from then on.  Scheduled patient for 2 weeks for her next injection.  Will forward to MMM to make sure these instructions are correct.

## 2019-03-28 ENCOUNTER — Ambulatory Visit (INDEPENDENT_AMBULATORY_CARE_PROVIDER_SITE_OTHER): Payer: BLUE CROSS/BLUE SHIELD | Admitting: *Deleted

## 2019-03-28 ENCOUNTER — Other Ambulatory Visit: Payer: Self-pay

## 2019-03-28 DIAGNOSIS — D519 Vitamin B12 deficiency anemia, unspecified: Secondary | ICD-10-CM

## 2019-03-28 DIAGNOSIS — E538 Deficiency of other specified B group vitamins: Secondary | ICD-10-CM | POA: Diagnosis not present

## 2019-03-28 NOTE — Progress Notes (Signed)
Pt given Cyanocobalamin inj Tolerated well 

## 2019-04-11 ENCOUNTER — Other Ambulatory Visit: Payer: Self-pay

## 2019-04-11 ENCOUNTER — Ambulatory Visit (INDEPENDENT_AMBULATORY_CARE_PROVIDER_SITE_OTHER): Payer: BLUE CROSS/BLUE SHIELD | Admitting: *Deleted

## 2019-04-11 DIAGNOSIS — E538 Deficiency of other specified B group vitamins: Secondary | ICD-10-CM | POA: Diagnosis not present

## 2019-04-11 DIAGNOSIS — D519 Vitamin B12 deficiency anemia, unspecified: Secondary | ICD-10-CM

## 2019-04-11 NOTE — Progress Notes (Signed)
Pt given Cyanocobalamin inj Tolerated well 

## 2019-05-10 ENCOUNTER — Other Ambulatory Visit: Payer: Self-pay

## 2019-05-11 ENCOUNTER — Ambulatory Visit (INDEPENDENT_AMBULATORY_CARE_PROVIDER_SITE_OTHER): Payer: BLUE CROSS/BLUE SHIELD | Admitting: *Deleted

## 2019-05-11 ENCOUNTER — Other Ambulatory Visit: Payer: Self-pay

## 2019-05-11 DIAGNOSIS — E538 Deficiency of other specified B group vitamins: Secondary | ICD-10-CM | POA: Diagnosis not present

## 2019-05-11 DIAGNOSIS — D519 Vitamin B12 deficiency anemia, unspecified: Secondary | ICD-10-CM

## 2019-05-11 NOTE — Progress Notes (Signed)
Pt given Cyanocobalamin inj Tolerated well 

## 2019-05-30 ENCOUNTER — Encounter: Payer: Self-pay | Admitting: Nurse Practitioner

## 2019-05-30 ENCOUNTER — Other Ambulatory Visit: Payer: Self-pay

## 2019-05-30 ENCOUNTER — Ambulatory Visit (INDEPENDENT_AMBULATORY_CARE_PROVIDER_SITE_OTHER): Payer: Medicare Other | Admitting: Nurse Practitioner

## 2019-05-30 DIAGNOSIS — D519 Vitamin B12 deficiency anemia, unspecified: Secondary | ICD-10-CM

## 2019-05-30 DIAGNOSIS — I10 Essential (primary) hypertension: Secondary | ICD-10-CM

## 2019-05-30 DIAGNOSIS — E785 Hyperlipidemia, unspecified: Secondary | ICD-10-CM | POA: Diagnosis not present

## 2019-05-30 DIAGNOSIS — E119 Type 2 diabetes mellitus without complications: Secondary | ICD-10-CM | POA: Diagnosis not present

## 2019-05-30 DIAGNOSIS — Z6837 Body mass index (BMI) 37.0-37.9, adult: Secondary | ICD-10-CM

## 2019-05-30 MED ORDER — METFORMIN HCL 1000 MG PO TABS: 1000.0000 mg | ORAL_TABLET | Freq: Two times a day (BID) | ORAL | 1 refills | Status: DC

## 2019-05-30 MED ORDER — LISINOPRIL 40 MG PO TABS: 40.0000 mg | ORAL_TABLET | Freq: Every day | ORAL | 1 refills | Status: DC

## 2019-05-30 MED ORDER — HYDROCHLOROTHIAZIDE 25 MG PO TABS: 25.0000 mg | ORAL_TABLET | Freq: Every day | ORAL | 1 refills | Status: DC

## 2019-05-30 MED ORDER — FENOFIBRATE 160 MG PO TABS: 160.0000 mg | ORAL_TABLET | Freq: Every day | ORAL | 1 refills | Status: DC

## 2019-05-30 MED ORDER — ATORVASTATIN CALCIUM 40 MG PO TABS: 40.0000 mg | ORAL_TABLET | Freq: Every day | ORAL | 1 refills | Status: DC

## 2019-05-30 NOTE — Progress Notes (Signed)
Virtual Visit via telephone Note  I connected with Emma Nunez on 05/30/19 at 10:00 by video and verified that I am speaking with the correct person using two identifiers. Emma Nunez is currently located at home and no one is currently with her during visit. The provider, Senetra-Margaret Hassell Done, FNP is located in their office at time of visit.  I discussed the limitations, risks, security and privacy concerns of performing an evaluation and management service by telephone and the availability of in person appointments. I also discussed with the patient that there may be a patient responsible charge related to this service. The patient expressed understanding and agreed to proceed.   History and Present Illness:   Chief Complaint: Medical Management of Chronic Issues    HPI:  1. Essential hypertension, benign No c/o chest pain, sob or headache. Does blood pressure at home. BP Readings from Last 3 Encounters:  02/21/19 (!) 148/74  10/17/18 (!) 146/77  07/14/18 115/66     2. Hyperlipidemia with target LDL less than 100 Trying to watch diet and has been swimming daily  3. Type 2 diabetes mellitus without complication, without long-term current use of insulin (HCC) Last HGBA1c was 7.6%. her fasting blood sugars have been running 150-160. Denies any low blood sugars  4. Anemia due to vitamin B12 deficiency, unspecified B12 deficiency type She gets monthly b12 injections  5. BMI 37.0-37.9, adult No recent weight gain    Outpatient Encounter Medications as of 05/30/2019  Medication Sig  . atorvastatin (LIPITOR) 40 MG tablet Take 1 tablet (40 mg total) by mouth daily at 6 PM.  . Blood Glucose Monitoring Suppl (CONTOUR NEXT MONITOR) w/Device KIT USE AS DIRECTED  . CONTOUR NEXT TEST test strip USE TO CHECK BLOOD SUGAR TWICE DAILY  . fenofibrate 160 MG tablet Take 1 tablet (160 mg total) by mouth daily.  . fluticasone (FLONASE) 50 MCG/ACT nasal spray Place 2 sprays into both nostrils  daily.  . hydrochlorothiazide (HYDRODIURIL) 25 MG tablet Take 1 tablet (25 mg total) by mouth daily.  Marland Kitchen LANCETS MICRO THIN 33G MISC Use to check BG once daily.  Dispense whichever goes with meter and that insurance will cover.  Marland Kitchen lisinopril (PRINIVIL,ZESTRIL) 40 MG tablet Take 1 tablet (40 mg total) by mouth daily.  . metFORMIN (GLUCOPHAGE) 1000 MG tablet Take 1 tablet (1,000 mg total) by mouth 2 (two) times daily with a meal.   Facility-Administered Encounter Medications as of 05/30/2019  Medication  . cyanocobalamin ((VITAMIN B-12)) injection 1,000 mcg    Past Surgical History:  Procedure Laterality Date  . ABDOMINAL HYSTERECTOMY    . TONSILLECTOMY AND ADENOIDECTOMY      Family History  Problem Relation Age of Onset  . COPD Father     New complaints: None today  Social history: Live by herself but her grandchildren and daughter live beside her.      Review of Systems  Constitutional: Negative for diaphoresis and weight loss.  Eyes: Negative for blurred vision, double vision and pain.  Respiratory: Negative for shortness of breath.   Cardiovascular: Negative for chest pain, palpitations, orthopnea and leg swelling.  Gastrointestinal: Negative for abdominal pain.  Skin: Negative for rash.  Neurological: Negative for dizziness, sensory change, loss of consciousness, weakness and headaches.  Endo/Heme/Allergies: Negative for polydipsia. Does not bruise/bleed easily.  Psychiatric/Behavioral: Negative for memory loss. The patient does not have insomnia.   All other systems reviewed and are negative.    Observations/Objective: Alert and oriented- answers  all questions appropriately No distress  Assessment and Plan: .Emma Nunez comes in today with chief complaint of Medical Management of Chronic Issues   Diagnosis and orders addressed:  1. Essential hypertension, benign Low sodium diet - lisinopril (ZESTRIL) 40 MG tablet; Take 1 tablet (40 mg total) by mouth daily.   Dispense: 90 tablet; Refill: 1 - hydrochlorothiazide (HYDRODIURIL) 25 MG tablet; Take 1 tablet (25 mg total) by mouth daily.  Dispense: 90 tablet; Refill: 1  2. Hyperlipidemia with target LDL less than 100 Low fat diet - atorvastatin (LIPITOR) 40 MG tablet; Take 1 tablet (40 mg total) by mouth daily at 6 PM.  Dispense: 90 tablet; Refill: 1 - fenofibrate 160 MG tablet; Take 1 tablet (160 mg total) by mouth daily.  Dispense: 90 tablet; Refill: 1  3. Type 2 diabetes mellitus without complication, without long-term current use of insulin (HCC) Watch carbs in diet - metFORMIN (GLUCOPHAGE) 1000 MG tablet; Take 1 tablet (1,000 mg total) by mouth 2 (two) times daily with a meal.  Dispense: 180 tablet; Refill: 1  4. Anemia due to vitamin B12 deficiency, unspecified B12 deficiency type Continue monthly b12 injections.  5. BMI 37.0-37.9, adult Discussed diet and exercise for person with BMI >25 Will recheck weight in 3-6 months   Previous labs reviewed Health Maintenance reviewed Diet and exercise encouraged  Follow up plan: 3 months   I discussed the assessment and treatment plan with the patient. The patient was provided an opportunity to ask questions and all were answered. The patient agreed with the plan and demonstrated an understanding of the instructions.   The patient was advised to call back or seek an in-person evaluation if the symptoms worsen or if the condition fails to improve as anticipated.  The above assessment and management plan was discussed with the patient. The patient verbalized understanding of and has agreed to the management plan. Patient is aware to call the clinic if symptoms persist or worsen. Patient is aware when to return to the clinic for a follow-up visit. Patient educated on when it is appropriate to go to the emergency department.   Time call ended:  10:15  I provided 15 minutes of non-face-to-face time during this encounter.    Genelle-Margaret Hassell Done,  FNP

## 2019-06-12 ENCOUNTER — Other Ambulatory Visit: Payer: Self-pay

## 2019-06-12 ENCOUNTER — Ambulatory Visit (INDEPENDENT_AMBULATORY_CARE_PROVIDER_SITE_OTHER): Payer: Medicare Other | Admitting: *Deleted

## 2019-06-12 DIAGNOSIS — D519 Vitamin B12 deficiency anemia, unspecified: Secondary | ICD-10-CM

## 2019-06-12 DIAGNOSIS — E538 Deficiency of other specified B group vitamins: Secondary | ICD-10-CM | POA: Diagnosis not present

## 2019-07-13 ENCOUNTER — Ambulatory Visit: Payer: Medicare Other

## 2019-07-25 ENCOUNTER — Ambulatory Visit (INDEPENDENT_AMBULATORY_CARE_PROVIDER_SITE_OTHER): Payer: Medicare Other | Admitting: *Deleted

## 2019-07-25 ENCOUNTER — Other Ambulatory Visit: Payer: Self-pay

## 2019-07-25 DIAGNOSIS — E538 Deficiency of other specified B group vitamins: Secondary | ICD-10-CM | POA: Diagnosis not present

## 2019-07-25 DIAGNOSIS — D519 Vitamin B12 deficiency anemia, unspecified: Secondary | ICD-10-CM

## 2019-07-25 NOTE — Progress Notes (Signed)
Pt given Cyanocobalamin inj Tolerated well 

## 2019-08-25 ENCOUNTER — Other Ambulatory Visit: Payer: Self-pay

## 2019-08-25 ENCOUNTER — Ambulatory Visit (INDEPENDENT_AMBULATORY_CARE_PROVIDER_SITE_OTHER): Payer: Medicare Other | Admitting: *Deleted

## 2019-08-25 DIAGNOSIS — D519 Vitamin B12 deficiency anemia, unspecified: Secondary | ICD-10-CM

## 2019-08-25 DIAGNOSIS — E538 Deficiency of other specified B group vitamins: Secondary | ICD-10-CM | POA: Diagnosis not present

## 2019-08-25 NOTE — Patient Instructions (Signed)

## 2019-08-25 NOTE — Progress Notes (Signed)
Cyanocobalamin injection to right deltoid.  Patient tolerated well. 

## 2019-09-07 ENCOUNTER — Ambulatory Visit (INDEPENDENT_AMBULATORY_CARE_PROVIDER_SITE_OTHER): Payer: Medicare Other | Admitting: Nurse Practitioner

## 2019-09-07 ENCOUNTER — Other Ambulatory Visit: Payer: Self-pay

## 2019-09-07 ENCOUNTER — Encounter: Payer: Self-pay | Admitting: Nurse Practitioner

## 2019-09-07 VITALS — BP 136/82 | HR 73 | Temp 99.6°F | Resp 20 | Ht 65.0 in | Wt 205.0 lb

## 2019-09-07 DIAGNOSIS — Z23 Encounter for immunization: Secondary | ICD-10-CM

## 2019-09-07 DIAGNOSIS — I10 Essential (primary) hypertension: Secondary | ICD-10-CM | POA: Diagnosis not present

## 2019-09-07 DIAGNOSIS — E785 Hyperlipidemia, unspecified: Secondary | ICD-10-CM

## 2019-09-07 DIAGNOSIS — Z6837 Body mass index (BMI) 37.0-37.9, adult: Secondary | ICD-10-CM

## 2019-09-07 DIAGNOSIS — D519 Vitamin B12 deficiency anemia, unspecified: Secondary | ICD-10-CM

## 2019-09-07 DIAGNOSIS — E119 Type 2 diabetes mellitus without complications: Secondary | ICD-10-CM

## 2019-09-07 LAB — BAYER DCA HB A1C WAIVED: HB A1C (BAYER DCA - WAIVED): 8.9 % — ABNORMAL HIGH (ref <7.0)

## 2019-09-07 MED ORDER — FENOFIBRATE 160 MG PO TABS: 160.0000 mg | ORAL_TABLET | Freq: Every day | ORAL | 1 refills | Status: DC

## 2019-09-07 MED ORDER — HYDROCHLOROTHIAZIDE 25 MG PO TABS: 25.0000 mg | ORAL_TABLET | Freq: Every day | ORAL | 1 refills | Status: DC

## 2019-09-07 MED ORDER — ATORVASTATIN CALCIUM 40 MG PO TABS: 40.0000 mg | ORAL_TABLET | Freq: Every day | ORAL | 1 refills | Status: DC

## 2019-09-07 MED ORDER — LISINOPRIL 40 MG PO TABS: 40.0000 mg | ORAL_TABLET | Freq: Every day | ORAL | 1 refills | Status: DC

## 2019-09-07 MED ORDER — METFORMIN HCL 1000 MG PO TABS: 1000.0000 mg | ORAL_TABLET | Freq: Two times a day (BID) | ORAL | 1 refills | Status: DC

## 2019-09-07 NOTE — Patient Instructions (Signed)
Carbohydrate Counting for Diabetes Mellitus, Adult  Carbohydrate counting is a method of keeping track of how many carbohydrates you eat. Eating carbohydrates naturally increases the amount of sugar (glucose) in the blood. Counting how many carbohydrates you eat helps keep your blood glucose within normal limits, which helps you manage your diabetes (diabetes mellitus). It is important to know how many carbohydrates you can safely have in each meal. This is different for every person. A diet and nutrition specialist (registered dietitian) can help you make a meal plan and calculate how many carbohydrates you should have at each meal and snack. Carbohydrates are found in the following foods:  Grains, such as breads and cereals.  Dried beans and soy products.  Starchy vegetables, such as potatoes, peas, and corn.  Fruit and fruit juices.  Milk and yogurt.  Sweets and snack foods, such as cake, cookies, candy, chips, and soft drinks. How do I count carbohydrates? There are two ways to count carbohydrates in food. You can use either of the methods or a combination of both. Reading "Nutrition Facts" on packaged food The "Nutrition Facts" list is included on the labels of almost all packaged foods and beverages in the [place] It includes:  The serving size.  Information about nutrients in each serving, including the grams (g) of carbohydrate per serving. To use the "Nutrition Facts":  Decide how many servings you will have.  Multiply the number of servings by the number of carbohydrates per serving.  The resulting number is the total amount of carbohydrates that you will be having. Learning standard serving sizes of other foods When you eat carbohydrate foods that are not packaged or do not include "Nutrition Facts" on the label, you need to measure the servings in order to count the amount of carbohydrates:  Measure the foods that you will eat with a food scale or measuring cup, if needed.   Decide how many standard-size servings you will eat.  Multiply the number of servings by 15. Most carbohydrate-rich foods have about 15 g of carbohydrates per serving. ? For example, if you eat 8 oz (170 g) of strawberries, you will have eaten 2 servings and 30 g of carbohydrates (2 servings x 15 g = 30 g).  For foods that have more than one food mixed, such as soups and casseroles, you must count the carbohydrates in each food that is included. The following list contains standard serving sizes of common carbohydrate-rich foods. Each of these servings has about 15 g of carbohydrates:   hamburger bun or  English muffin.   oz (15 mL) syrup.   oz (14 g) jelly.  1 slice of bread.  1 six-inch tortilla.  3 oz (85 g) cooked rice or pasta.  4 oz (113 g) cooked dried beans.  4 oz (113 g) starchy vegetable, such as peas, corn, or potatoes.  4 oz (113 g) hot cereal.  4 oz (113 g) mashed potatoes or  of a large baked potato.  4 oz (113 g) canned or frozen fruit.  4 oz (120 mL) fruit juice.  4-6 crackers.  6 chicken nuggets.  6 oz (170 g) unsweetened dry cereal.  6 oz (170 g) plain fat-free yogurt or yogurt sweetened with artificial sweeteners.  8 oz (240 mL) milk.  8 oz (170 g) fresh fruit or one small piece of fruit.  24 oz (680 g) popped popcorn. Example of carbohydrate counting Sample meal  3 oz (85 g) chicken breast.  6 oz (170 g)   brown rice.  4 oz (113 g) corn.  8 oz (240 mL) milk.  8 oz (170 g) strawberries with sugar-free whipped topping. Carbohydrate calculation 1. Identify the foods that contain carbohydrates: ? Rice. ? Corn. ? Milk. ? Strawberries. 2. Calculate how many servings you have of each food: ? 2 servings rice. ? 1 serving corn. ? 1 serving milk. ? 1 serving strawberries. 3. Multiply each number of servings by 15 g: ? 2 servings rice x 15 g = 30 g. ? 1 serving corn x 15 g = 15 g. ? 1 serving milk x 15 g = 15 g. ? 1 serving  strawberries x 15 g = 15 g. 4. Add together all of the amounts to find the total grams of carbohydrates eaten: ? 30 g + 15 g + 15 g + 15 g = 75 g of carbohydrates total. Summary  Carbohydrate counting is a method of keeping track of how many carbohydrates you eat.  Eating carbohydrates naturally increases the amount of sugar (glucose) in the blood.  Counting how many carbohydrates you eat helps keep your blood glucose within normal limits, which helps you manage your diabetes.  A diet and nutrition specialist (registered dietitian) can help you make a meal plan and calculate how many carbohydrates you should have at each meal and snack. This information is not intended to replace advice given to you by your health care provider. Make sure you discuss any questions you have with your health care provider. Document Released: 12/07/2005 Document Revised: 07/01/2017 Document Reviewed: 05/20/2016 Elsevier Patient Education  2020 [employer] Inc.  

## 2019-09-07 NOTE — Addendum Note (Signed)
Addended by: Rolena Infante on: 09/07/2019 04:39 PM   Modules accepted: Orders

## 2019-09-07 NOTE — Progress Notes (Signed)
Subjective:    Patient ID: Emma Nunez, female    DOB: [birth year], 68 y.o.   MRN: [id number]   Chief Complaint: Medical Management of Chronic Issues    HPI:  1. Essential hypertension, benign No c/o chest pain, sob or headache. Does not check blood pressure at home. BP Readings from Last 3 Encounters:  09/07/19 (!) 151/91  02/21/19 (!) 148/74  10/17/18 (!) 146/77     2. Hyperlipidemia with target LDL less than 100 Has not been her diet and has been doing no exercise. Lab Results  Component Value Date   CHOL 183 02/21/2019   HDL 47 02/21/2019   LDLCALC 97 02/21/2019   TRIG 195 (H) 02/21/2019   CHOLHDL 3.9 02/21/2019     3. Type 2 diabetes mellitus without complication, without long-term current use of insulin (HCC) has not been checking her blood sugars at home since all of this covid stuff. She also has not been watching her diet. Lab Results  Component Value Date   HGBA1C 7.6 (H) 02/21/2019     4. Anemia due to vitamin B12 deficiency, unspecified B12 deficiency type Comes in once a month for b12 injections Lab Results  Component Value Date   VITAMINB12 222 (L) 10/17/2018     5. BMI 37.0-37.9, adult No recent weight chnages Wt Readings from Last 3 Encounters:  09/07/19 205 lb (93 kg)  02/21/19 208 lb (94.3 kg)  10/17/18 208 lb (94.3 kg)   BMI Readings from Last 3 Encounters:  09/07/19 34.11 kg/m  02/21/19 34.61 kg/m  10/17/18 34.61 kg/m       Outpatient Encounter Medications as of 09/07/2019  Medication Sig  . atorvastatin (LIPITOR) 40 MG tablet Take 1 tablet (40 mg total) by mouth daily at 6 PM.  . Blood Glucose Monitoring Suppl (CONTOUR NEXT MONITOR) w/Device KIT USE AS DIRECTED  . CONTOUR NEXT TEST test strip USE TO CHECK BLOOD SUGAR TWICE DAILY  . fenofibrate 160 MG tablet Take 1 tablet (160 mg total) by mouth daily.  . hydrochlorothiazide (HYDRODIURIL) 25 MG tablet Take 1 tablet (25 mg total) by mouth daily.  Marland Kitchen LANCETS MICRO THIN 33G MISC  Use to check BG once daily.  Dispense whichever goes with meter and that insurance will cover.  Marland Kitchen lisinopril (ZESTRIL) 40 MG tablet Take 1 tablet (40 mg total) by mouth daily.  . metFORMIN (GLUCOPHAGE) 1000 MG tablet Take 1 tablet (1,000 mg total) by mouth 2 (two) times daily with a meal.  . fluticasone (FLONASE) 50 MCG/ACT nasal spray Place 2 sprays into both nostrils daily. (Patient not taking: Reported on 09/07/2019)    Past Surgical History:  Procedure Laterality Date  . ABDOMINAL HYSTERECTOMY    . TONSILLECTOMY AND ADENOIDECTOMY      Family History  Problem Relation Age of Onset  . COPD Father     New complaints: None today  Social history: Lives by herself and is next door to her daughter  Controlled substance contract: n/a    Review of Systems  Constitutional: Negative for activity change and appetite change.  HENT: Negative.   Eyes: Negative for pain.  Respiratory: Negative for shortness of breath.   Cardiovascular: Negative for chest pain, palpitations and leg swelling.  Gastrointestinal: Negative for abdominal pain.  Endocrine: Negative for polydipsia.  Genitourinary: Negative.   Skin: Negative for rash.  Neurological: Negative for dizziness, weakness and headaches.  Hematological: Does not bruise/bleed easily.  Psychiatric/Behavioral: Negative.   All other systems reviewed and are negative.  Objective:   Physical Exam Vitals signs and nursing note reviewed.  Constitutional:      General: She is not in acute distress.    Appearance: Normal appearance. She is well-developed.  HENT:     Head: Normocephalic.     Nose: Nose normal.  Eyes:     Pupils: Pupils are equal, round, and reactive to light.  Neck:     Musculoskeletal: Normal range of motion and neck supple.     Vascular: No carotid bruit or JVD.  Cardiovascular:     Rate and Rhythm: Normal rate and regular rhythm.     Heart sounds: Normal heart sounds.  Pulmonary:     Effort: Pulmonary  effort is normal. No respiratory distress.     Breath sounds: Normal breath sounds. No wheezing or rales.  Chest:     Chest wall: No tenderness.  Abdominal:     General: Bowel sounds are normal. There is no distension or abdominal bruit.     Palpations: Abdomen is soft. There is no hepatomegaly, splenomegaly, mass or pulsatile mass.     Tenderness: There is no abdominal tenderness.  Musculoskeletal: Normal range of motion.  Lymphadenopathy:     Cervical: No cervical adenopathy.  Skin:    General: Skin is warm and dry.  Neurological:     Mental Status: She is alert and oriented to person, place, and time.     Deep Tendon Reflexes: Reflexes are normal and symmetric.  Psychiatric:        Behavior: Behavior normal.        Thought Content: Thought content normal.        Judgment: Judgment normal.     BP 136/82 (BP Location: Left Arm, Cuff Size: Normal)   Pulse 73   Temp 99.6 F (37.6 C) (Oral)   Resp 20   Ht _0  (1.651 m)   Wt 205 lb (93 kg)   SpO2 99%   BMI 34.11 kg/m   hgba1c 8.9        Assessment & Plan:  Emma Nunez comes in today with chief complaint of Medical Management of Chronic Issues   Diagnosis and orders addressed:  1. Essential hypertension, benign Low sodium diet - CMP14+EGFR - lisinopril (ZESTRIL) 40 MG tablet; Take 1 tablet (40 mg total) by mouth daily.  Dispense: 90 tablet; Refill: 1 - hydrochlorothiazide (HYDRODIURIL) 25 MG tablet; Take 1 tablet (25 mg total) by mouth daily.  Dispense: 90 tablet; Refill: 1  2. Hyperlipidemia with target LDL less than 100 Low fat det - Lipid panel - atorvastatin (LIPITOR) 40 MG tablet; Take 1 tablet (40 mg total) by mouth daily at 6 PM.  Dispense: 90 tablet; Refill: 1 - fenofibrate 160 MG tablet; Take 1 tablet (160 mg total) by mouth daily.  Dispense: 90 tablet; Refill: 1  3. Type 2 diabetes mellitus without complication, without long-term current use of insulin ([place]) Stricter carb counting Patient want sto  see if she can get down on her own before adding any meds - Bayer DCA Hb A1c Waived - Microalbumin / creatinine urine ratio - metFORMIN (GLUCOPHAGE) 1000 MG tablet; Take 1 tablet (1,000 mg total) by mouth 2 (two) times daily with a meal.  Dispense: 180 tablet; Refill: 1  4. Anemia due to vitamin B12 deficiency, unspecified B12 deficiency type Continue b12 injections  5. BMI 37.0-37.9, adult Discussed diet and exercise for person with BMI >25 Will recheck weight in 3-6 months    Labs pending Health Maintenance  reviewed Diet and exercise encouraged  Follow up plan: 1 month - diabetes only   [place]-Margaret Hassell Done, FNP

## 2019-09-08 LAB — CMP14+EGFR
ALT: 41 IU/L — ABNORMAL HIGH (ref 0–32)
AST: 33 IU/L (ref 0–40)
Albumin/Globulin Ratio: 1.8 (ref 1.2–2.2)
Albumin: 4.2 g/dL (ref 3.8–4.8)
Alkaline Phosphatase: 107 IU/L (ref 39–117)
BUN/Creatinine Ratio: 17 (ref 12–28)
BUN: 14 mg/dL (ref 8–27)
Bilirubin Total: 0.3 mg/dL (ref 0.0–1.2)
CO2: 24 mmol/L (ref 20–29)
Calcium: 9.5 mg/dL (ref 8.7–10.3)
Chloride: 97 mmol/L (ref 96–106)
Creatinine, Ser: 0.84 mg/dL (ref 0.57–1.00)
GFR calc Af Amer: 83 mL/min/{1.73_m2} (ref >59)
GFR calc non Af Amer: 72 mL/min/{1.73_m2} (ref >59)
Globulin, Total: 2.3 g/dL (ref 1.5–4.5)
Glucose: 179 mg/dL — ABNORMAL HIGH (ref 65–99)
Potassium: 4 mmol/L (ref 3.5–5.2)
Sodium: 138 mmol/L (ref 134–144)
Total Protein: 6.5 g/dL (ref 6.0–8.5)

## 2019-09-08 LAB — LIPID PANEL
Chol/HDL Ratio: 3.6 ratio (ref 0.0–4.4)
Cholesterol, Total: 167 mg/dL (ref 100–199)
HDL: 47 mg/dL (ref >39)
LDL Chol Calc (NIH): 86 mg/dL (ref 0–99)
Triglycerides: 201 mg/dL — ABNORMAL HIGH (ref 0–149)
VLDL Cholesterol Cal: 34 mg/dL (ref 5–40)

## 2019-09-22 ENCOUNTER — Other Ambulatory Visit: Payer: Self-pay

## 2019-09-25 ENCOUNTER — Ambulatory Visit (INDEPENDENT_AMBULATORY_CARE_PROVIDER_SITE_OTHER): Payer: Medicare Other

## 2019-09-25 ENCOUNTER — Other Ambulatory Visit: Payer: Self-pay

## 2019-09-25 DIAGNOSIS — E538 Deficiency of other specified B group vitamins: Secondary | ICD-10-CM | POA: Diagnosis not present

## 2019-09-25 NOTE — Progress Notes (Signed)
Cyanocobalamin injection given to left deltoid.  Patient tolerated well. 

## 2019-10-13 ENCOUNTER — Encounter: Payer: Self-pay | Admitting: Nurse Practitioner

## 2019-10-13 ENCOUNTER — Ambulatory Visit (INDEPENDENT_AMBULATORY_CARE_PROVIDER_SITE_OTHER): Payer: Medicare Other | Admitting: Nurse Practitioner

## 2019-10-13 ENCOUNTER — Other Ambulatory Visit: Payer: Self-pay

## 2019-10-13 VITALS — BP 137/80 | HR 88 | Temp 98.4°F | Resp 20 | Ht 65.0 in | Wt 204.0 lb

## 2019-10-13 DIAGNOSIS — E119 Type 2 diabetes mellitus without complications: Secondary | ICD-10-CM | POA: Diagnosis not present

## 2019-10-13 LAB — BAYER DCA HB A1C WAIVED: HB A1C (BAYER DCA - WAIVED): 8.3 % — ABNORMAL HIGH (ref <7.0)

## 2019-10-13 MED ORDER — GLIMEPIRIDE 2 MG PO TABS: 2.0000 mg | ORAL_TABLET | Freq: Every day | ORAL | 3 refills | Status: DC

## 2019-10-13 NOTE — Patient Instructions (Signed)
Carbohydrate Counting for Diabetes Mellitus, Adult  Carbohydrate counting is a method of keeping track of how many carbohydrates you eat. Eating carbohydrates naturally increases the amount of sugar (glucose) in the blood. Counting how many carbohydrates you eat helps keep your blood glucose within normal limits, which helps you manage your diabetes (diabetes mellitus). It is important to know how many carbohydrates you can safely have in each meal. This is different for every person. A diet and nutrition specialist (registered dietitian) can help you make a meal plan and calculate how many carbohydrates you should have at each meal and snack. Carbohydrates are found in the following foods:  Grains, such as breads and cereals.  Dried beans and soy products.  Starchy vegetables, such as potatoes, peas, and corn.  Fruit and fruit juices.  Milk and yogurt.  Sweets and snack foods, such as cake, cookies, candy, chips, and soft drinks. How do I count carbohydrates? There are two ways to count carbohydrates in food. You can use either of the methods or a combination of both. Reading "Nutrition Facts" on packaged food The "Nutrition Facts" list is included on the labels of almost all packaged foods and beverages in the [place] It includes:  The serving size.  Information about nutrients in each serving, including the grams (g) of carbohydrate per serving. To use the "Nutrition Facts":  Decide how many servings you will have.  Multiply the number of servings by the number of carbohydrates per serving.  The resulting number is the total amount of carbohydrates that you will be having. Learning standard serving sizes of other foods When you eat carbohydrate foods that are not packaged or do not include "Nutrition Facts" on the label, you need to measure the servings in order to count the amount of carbohydrates:  Measure the foods that you will eat with a food scale or measuring cup, if needed.   Decide how many standard-size servings you will eat.  Multiply the number of servings by 15. Most carbohydrate-rich foods have about 15 g of carbohydrates per serving. ? For example, if you eat 8 oz (170 g) of strawberries, you will have eaten 2 servings and 30 g of carbohydrates (2 servings x 15 g = 30 g).  For foods that have more than one food mixed, such as soups and casseroles, you must count the carbohydrates in each food that is included. The following list contains standard serving sizes of common carbohydrate-rich foods. Each of these servings has about 15 g of carbohydrates:   hamburger bun or  English muffin.   oz (15 mL) syrup.   oz (14 g) jelly.  1 slice of bread.  1 six-inch tortilla.  3 oz (85 g) cooked rice or pasta.  4 oz (113 g) cooked dried beans.  4 oz (113 g) starchy vegetable, such as peas, corn, or potatoes.  4 oz (113 g) hot cereal.  4 oz (113 g) mashed potatoes or  of a large baked potato.  4 oz (113 g) canned or frozen fruit.  4 oz (120 mL) fruit juice.  4-6 crackers.  6 chicken nuggets.  6 oz (170 g) unsweetened dry cereal.  6 oz (170 g) plain fat-free yogurt or yogurt sweetened with artificial sweeteners.  8 oz (240 mL) milk.  8 oz (170 g) fresh fruit or one small piece of fruit.  24 oz (680 g) popped popcorn. Example of carbohydrate counting Sample meal  3 oz (85 g) chicken breast.  6 oz (170 g)   brown rice.  4 oz (113 g) corn.  8 oz (240 mL) milk.  8 oz (170 g) strawberries with sugar-free whipped topping. Carbohydrate calculation 1. Identify the foods that contain carbohydrates: ? Rice. ? Corn. ? Milk. ? Strawberries. 2. Calculate how many servings you have of each food: ? 2 servings rice. ? 1 serving corn. ? 1 serving milk. ? 1 serving strawberries. 3. Multiply each number of servings by 15 g: ? 2 servings rice x 15 g = 30 g. ? 1 serving corn x 15 g = 15 g. ? 1 serving milk x 15 g = 15 g. ? 1 serving  strawberries x 15 g = 15 g. 4. Add together all of the amounts to find the total grams of carbohydrates eaten: ? 30 g + 15 g + 15 g + 15 g = 75 g of carbohydrates total. Summary  Carbohydrate counting is a method of keeping track of how many carbohydrates you eat.  Eating carbohydrates naturally increases the amount of sugar (glucose) in the blood.  Counting how many carbohydrates you eat helps keep your blood glucose within normal limits, which helps you manage your diabetes.  A diet and nutrition specialist (registered dietitian) can help you make a meal plan and calculate how many carbohydrates you should have at each meal and snack. This information is not intended to replace advice given to you by your health care provider. Make sure you discuss any questions you have with your health care provider. Document Released: 12/07/2005 Document Revised: 07/01/2017 Document Reviewed: 05/20/2016 Elsevier Patient Education  2020 [employer] Inc.  

## 2019-10-13 NOTE — Progress Notes (Signed)
Subjective:    Patient ID: Emma Nunez, female    DOB: March 17, 1951, 68 y.o.   MRN: [id number]   Chief Complaint: Diabetes   HPI Patient comes in today for follow up of diabetes only today. She was seen for follow up of chronic medical issues on 09/07/19. At that appointment she had not been checking her blood sugars at home nor was she watching her diet. Her HGBA1c was 8.9%. she refused any medication changes and wanted to try to get it down on her own. The only thing se was on was metformin 1000mg  BID. Her blood sugars are in the160-170 when she checks them. She has not been watching diet very closely but sh ehas started walking.  Review of Systems  Constitutional: Negative for activity change and appetite change.  HENT: Negative.   Eyes: Negative for pain.  Respiratory: Negative for shortness of breath.   Cardiovascular: Negative for chest pain, palpitations and leg swelling.  Gastrointestinal: Negative for abdominal pain.  Endocrine: Negative for polydipsia.  Genitourinary: Negative.   Skin: Negative for rash.  Neurological: Negative for dizziness, weakness and headaches.  Hematological: Does not bruise/bleed easily.  Psychiatric/Behavioral: Negative.   All other systems reviewed and are negative.      Objective:   Physical Exam Vitals signs and nursing note reviewed.  Constitutional:      General: She is not in acute distress.    Appearance: Normal appearance. She is well-developed.  HENT:     Head: Normocephalic.     Nose: Nose normal.  Eyes:     Pupils: Pupils are equal, round, and reactive to light.  Neck:     Musculoskeletal: Normal range of motion and neck supple.     Vascular: No carotid bruit or JVD.  Cardiovascular:     Rate and Rhythm: Normal rate and regular rhythm.     Heart sounds: Normal heart sounds.  Pulmonary:     Effort: Pulmonary effort is normal. No respiratory distress.     Breath sounds: Normal breath sounds. No wheezing or rales.  Chest:   Chest wall: No tenderness.  Abdominal:     General: Bowel sounds are normal. There is no distension or abdominal bruit.     Palpations: Abdomen is soft. There is no hepatomegaly, splenomegaly, mass or pulsatile mass.     Tenderness: There is no abdominal tenderness.  Musculoskeletal: Normal range of motion.  Lymphadenopathy:     Cervical: No cervical adenopathy.  Skin:    General: Skin is warm and dry.  Neurological:     Mental Status: She is alert and oriented to person, place, and time.     Deep Tendon Reflexes: Reflexes are normal and symmetric.  Psychiatric:        Behavior: Behavior normal.        Thought Content: Thought content normal.        Judgment: Judgment normal.    BP 137/80   Pulse 88   Temp 98.4 F (36.9 C) (Temporal)   Resp 20   Ht 5\' 5"  (1.651 m)   Wt 204 lb (92.5 kg)   SpO2 99%   BMI 33.95 kg/m    hgba1c 8.3%    Assessment & Plan:  [name] in today with chief complaint of Diabetes   1. Type 2 diabetes mellitus without complication, without long-term current use of insulin (HCC) Continue metformin 1000mg  BID Watch carbs in diet RTO in 3 months - Bayer DCA Hb A1c Waived - glimepiride (  AMARYL) 2 MG tablet; Take 1 tablet (2 mg total) by mouth daily before breakfast.  Dispense: 30 tablet; Refill: 3  Saren-Margaret Daphine Deutscher, FNP

## 2019-10-23 ENCOUNTER — Ambulatory Visit: Payer: Medicare Other

## 2019-10-25 ENCOUNTER — Ambulatory Visit (INDEPENDENT_AMBULATORY_CARE_PROVIDER_SITE_OTHER): Payer: Medicare Other

## 2019-10-25 ENCOUNTER — Other Ambulatory Visit: Payer: Self-pay

## 2019-10-25 DIAGNOSIS — E538 Deficiency of other specified B group vitamins: Secondary | ICD-10-CM | POA: Diagnosis not present

## 2019-10-25 NOTE — Progress Notes (Signed)
Cyanocobalamin injection given to right deltoid.  Patient tolerated well. 

## 2019-11-21 ENCOUNTER — Other Ambulatory Visit: Payer: Self-pay

## 2019-11-22 ENCOUNTER — Ambulatory Visit (INDEPENDENT_AMBULATORY_CARE_PROVIDER_SITE_OTHER): Payer: Medicare Other

## 2019-11-22 DIAGNOSIS — E538 Deficiency of other specified B group vitamins: Secondary | ICD-10-CM | POA: Diagnosis not present

## 2019-11-22 NOTE — Progress Notes (Signed)
Cyanocobalamin injection given to left deltoid.  Patient tolerated well. 

## 2019-12-25 ENCOUNTER — Other Ambulatory Visit: Payer: Self-pay

## 2019-12-25 ENCOUNTER — Ambulatory Visit (INDEPENDENT_AMBULATORY_CARE_PROVIDER_SITE_OTHER): Payer: Medicare Other | Admitting: *Deleted

## 2019-12-25 DIAGNOSIS — E538 Deficiency of other specified B group vitamins: Secondary | ICD-10-CM

## 2019-12-25 NOTE — Progress Notes (Signed)
Vitamin b12 injection given and patient tolerated well.  

## 2020-01-07 ENCOUNTER — Other Ambulatory Visit: Payer: Self-pay | Admitting: Nurse Practitioner

## 2020-01-07 DIAGNOSIS — E119 Type 2 diabetes mellitus without complications: Secondary | ICD-10-CM

## 2020-01-18 ENCOUNTER — Ambulatory Visit: Payer: Self-pay | Admitting: Nurse Practitioner

## 2020-01-22 ENCOUNTER — Other Ambulatory Visit: Payer: Self-pay

## 2020-01-22 DIAGNOSIS — D519 Vitamin B12 deficiency anemia, unspecified: Secondary | ICD-10-CM

## 2020-01-22 DIAGNOSIS — Z780 Asymptomatic menopausal state: Secondary | ICD-10-CM

## 2020-01-22 DIAGNOSIS — Z0000 Encounter for general adult medical examination without abnormal findings: Secondary | ICD-10-CM

## 2020-01-22 DIAGNOSIS — E559 Vitamin D deficiency, unspecified: Secondary | ICD-10-CM

## 2020-01-22 DIAGNOSIS — I10 Essential (primary) hypertension: Secondary | ICD-10-CM

## 2020-01-26 ENCOUNTER — Other Ambulatory Visit: Payer: Self-pay

## 2020-01-26 ENCOUNTER — Ambulatory Visit (INDEPENDENT_AMBULATORY_CARE_PROVIDER_SITE_OTHER): Payer: Medicare Other | Admitting: *Deleted

## 2020-01-26 DIAGNOSIS — E538 Deficiency of other specified B group vitamins: Secondary | ICD-10-CM

## 2020-01-26 NOTE — Progress Notes (Signed)
B12 injection given and tolerated well.  

## 2020-01-29 ENCOUNTER — Encounter: Payer: Self-pay | Admitting: Nurse Practitioner

## 2020-01-29 ENCOUNTER — Ambulatory Visit: Payer: Medicare Other | Admitting: Nurse Practitioner

## 2020-01-29 ENCOUNTER — Other Ambulatory Visit: Payer: Medicare Other

## 2020-01-29 ENCOUNTER — Other Ambulatory Visit: Payer: Self-pay

## 2020-01-29 ENCOUNTER — Ambulatory Visit (INDEPENDENT_AMBULATORY_CARE_PROVIDER_SITE_OTHER): Payer: Medicare Other

## 2020-01-29 VITALS — BP 140/86 | HR 79 | Temp 97.5°F | Resp 20 | Ht 65.0 in | Wt 205.0 lb

## 2020-01-29 DIAGNOSIS — Z780 Asymptomatic menopausal state: Secondary | ICD-10-CM

## 2020-01-29 DIAGNOSIS — E559 Vitamin D deficiency, unspecified: Secondary | ICD-10-CM | POA: Diagnosis not present

## 2020-01-29 DIAGNOSIS — Z0000 Encounter for general adult medical examination without abnormal findings: Secondary | ICD-10-CM

## 2020-01-29 DIAGNOSIS — I10 Essential (primary) hypertension: Secondary | ICD-10-CM

## 2020-01-29 DIAGNOSIS — E785 Hyperlipidemia, unspecified: Secondary | ICD-10-CM | POA: Diagnosis not present

## 2020-01-29 DIAGNOSIS — D519 Vitamin B12 deficiency anemia, unspecified: Secondary | ICD-10-CM

## 2020-01-29 DIAGNOSIS — Z13820 Encounter for screening for osteoporosis: Secondary | ICD-10-CM | POA: Diagnosis not present

## 2020-01-29 DIAGNOSIS — E119 Type 2 diabetes mellitus without complications: Secondary | ICD-10-CM | POA: Diagnosis not present

## 2020-01-29 DIAGNOSIS — Z6837 Body mass index (BMI) 37.0-37.9, adult: Secondary | ICD-10-CM

## 2020-01-29 LAB — BAYER DCA HB A1C WAIVED: HB A1C (BAYER DCA - WAIVED): 6.8 % (ref <7.0)

## 2020-01-29 MED ORDER — FENOFIBRATE 160 MG PO TABS: 160.0000 mg | ORAL_TABLET | Freq: Every day | ORAL | 1 refills | Status: DC

## 2020-01-29 MED ORDER — ATORVASTATIN CALCIUM 40 MG PO TABS: 40.0000 mg | ORAL_TABLET | Freq: Every day | ORAL | 1 refills | Status: DC

## 2020-01-29 MED ORDER — METFORMIN HCL 1000 MG PO TABS: 1000.0000 mg | ORAL_TABLET | Freq: Two times a day (BID) | ORAL | 1 refills | Status: DC

## 2020-01-29 MED ORDER — GLIMEPIRIDE 2 MG PO TABS: ORAL_TABLET | ORAL | 1 refills | Status: DC

## 2020-01-29 MED ORDER — HYDROCHLOROTHIAZIDE 25 MG PO TABS: 25.0000 mg | ORAL_TABLET | Freq: Every day | ORAL | 1 refills | Status: DC

## 2020-01-29 NOTE — Patient Instructions (Signed)

## 2020-01-29 NOTE — Progress Notes (Signed)
Subjective:    Patient ID: Emma Nunez, female    DOB: [birth year], 69 y.o.   MRN: [id number]   Chief Complaint: Medical Management of Chronic Issues    HPI:  1. Type 2 diabetes mellitus without complication, without long-term current use of insulin (HCC) Fasting blood sugars have been 160-180 most mornings. She does not check blood sugar everyday. Patient refused medication changes, she anted to try to get it down on her own. Lab Results  Component Value Date   HGBA1C 8.3 (H) 10/13/2019     2. Essential hypertension, benign No c/o chest pain, sob or headache. Does not check blood pressure at home. BP Readings from Last 3 Encounters:  10/13/19 137/80  09/07/19 136/82  02/21/19 (!) 148/74     3. Hyperlipidemia with target LDL less than 100 Does not watch diet and does very little exercise. Lab Results  Component Value Date   CHOL 167 09/07/2019   HDL 47 09/07/2019   LDLCALC 86 09/07/2019   TRIG 201 (H) 09/07/2019   CHOLHDL 3.6 09/07/2019     4. Anemia due to vitamin B12 deficiency, unspecified B12 deficiency type She has a monthly b12 injection Lab Results  Component Value Date   VITAMINB12 222 (L) 10/17/2018    5. BMI 37.0-37.9, adult No recent Weight changes Wt Readings from Last 3 Encounters:  01/29/20 205 lb (93 kg)  10/13/19 204 lb (92.5 kg)  09/07/19 205 lb (93 kg)    BMI Readings from Last 3 Encounters:  01/29/20 34.11 kg/m  10/13/19 33.95 kg/m  09/07/19 34.11 kg/m     Outpatient Encounter Medications as of 01/29/2020  Medication Sig  . atorvastatin (LIPITOR) 40 MG tablet Take 1 tablet (40 mg total) by mouth daily at 6 PM.  . Blood Glucose Monitoring Suppl (CONTOUR NEXT MONITOR) w/Device KIT USE AS DIRECTED  . CONTOUR NEXT TEST test strip USE TO CHECK BLOOD SUGAR TWICE DAILY  . fenofibrate 160 MG tablet Take 1 tablet (160 mg total) by mouth daily.  . fluticasone (FLONASE) 50 MCG/ACT nasal spray Place 2 sprays into both nostrils daily.  (Patient not taking: Reported on 10/13/2019)  . glimepiride (AMARYL) 2 MG tablet TAKE 1 TABLET BY MOUTH DAILY BEFORE BREAKFAST.  . hydrochlorothiazide (HYDRODIURIL) 25 MG tablet Take 1 tablet (25 mg total) by mouth daily.  Marland Kitchen LANCETS MICRO THIN 33G MISC Use to check BG once daily.  Dispense whichever goes with meter and that insurance will cover.  Marland Kitchen lisinopril (ZESTRIL) 40 MG tablet Take 1 tablet (40 mg total) by mouth daily.  . metFORMIN (GLUCOPHAGE) 1000 MG tablet Take 1 tablet (1,000 mg total) by mouth 2 (two) times daily with a meal.     Past Surgical History:  Procedure Laterality Date  . ABDOMINAL HYSTERECTOMY    . TONSILLECTOMY AND ADENOIDECTOMY      Family History  Problem Relation Age of Onset  . COPD Father     New complaints: None today  Social history: Lives by herself  Controlled substance contract: n/a    Review of Systems  Constitutional: Negative for diaphoresis.  Eyes: Negative for pain.  Respiratory: Negative for shortness of breath.   Cardiovascular: Negative for chest pain, palpitations and leg swelling.  Gastrointestinal: Negative for abdominal pain.  Endocrine: Negative for polydipsia.  Skin: Negative for rash.  Neurological: Negative for dizziness, weakness and headaches.  Hematological: Does not bruise/bleed easily.  All other systems reviewed and are negative.      Objective:  Physical Exam Vitals and nursing note reviewed.  Constitutional:      General: She is not in acute distress.    Appearance: Normal appearance. She is well-developed.  HENT:     Head: Normocephalic.     Nose: Nose normal.  Eyes:     Pupils: Pupils are equal, round, and reactive to light.  Neck:     Vascular: No carotid bruit or JVD.  Cardiovascular:     Rate and Rhythm: Normal rate and regular rhythm.     Heart sounds: Normal heart sounds.  Pulmonary:     Effort: Pulmonary effort is normal. No respiratory distress.     Breath sounds: Normal breath sounds. No  wheezing or rales.  Chest:     Chest wall: No tenderness.  Abdominal:     General: Bowel sounds are normal. There is no distension or abdominal bruit.     Palpations: Abdomen is soft. There is no hepatomegaly, splenomegaly, mass or pulsatile mass.     Tenderness: There is no abdominal tenderness.  Musculoskeletal:        General: Normal range of motion.     Cervical back: Normal range of motion and neck supple.  Lymphadenopathy:     Cervical: No cervical adenopathy.  Skin:    General: Skin is warm and dry.  Neurological:     Mental Status: She is alert and oriented to person, place, and time.     Deep Tendon Reflexes: Reflexes are normal and symmetric.  Psychiatric:        Behavior: Behavior normal.        Thought Content: Thought content normal.        Judgment: Judgment normal.    BP 140/86   Pulse 79   Temp (!) 97.5 F (36.4 C) (Temporal)   Resp 20   Ht '5\' 5"'$  (1.651 m)   Wt 205 lb (93 kg)   SpO2 98%   BMI 34.11 kg/m   Hgba1c 6.8%       Assessment & Plan:  [name] comes in today with chief complaint of Medical Management of Chronic Issues   Diagnosis and orders addressed:  1. Type 2 diabetes mellitus without complication, without long-term current use of insulin (HCC) Continue to watch carbs on diet - Bayer DCA Hb A1c Waived - Microalbumin / creatinine urine ratio - glimepiride (AMARYL) 2 MG tablet; TAKE 1 TABLET BY MOUTH DAILY BEFORE BREAKFAST.  Dispense: 90 tablet; Refill: 1 - metFORMIN (GLUCOPHAGE) 1000 MG tablet; Take 1 tablet (1,000 mg total) by mouth 2 (two) times daily with a meal.  Dispense: 180 tablet; Refill: 1  2. Essential hypertension, benign Low sodium diet - CMP14+EGFR - CBC with Differential/Platelet - hydrochlorothiazide (HYDRODIURIL) 25 MG tablet; Take 1 tablet (25 mg total) by mouth daily.  Dispense: 90 tablet; Refill: 1  3. Hyperlipidemia with target LDL less than 100 Low fat diet - Lipid panel - atorvastatin (LIPITOR) 40 MG  tablet; Take 1 tablet (40 mg total) by mouth daily at 6 PM.  Dispense: 90 tablet; Refill: 1 - fenofibrate 160 MG tablet; Take 1 tablet (160 mg total) by mouth daily.  Dispense: 90 tablet; Refill: 1  4. Anemia due to vitamin B12 deficiency, unspecified B12 deficiency type Labs pending  5. BMI 37.0-37.9, adult Discussed diet and exercise for person with BMI >25 Will recheck weight in 3-6 months    Labs pending Health Maintenance reviewed Diet and exercise encouraged  Follow up plan: 3 months   Berlinda-Margaret  Hassell Done, [place]

## 2020-01-30 LAB — CMP14+EGFR
ALT: 28 IU/L (ref 0–32)
AST: 26 IU/L (ref 0–40)
Albumin/Globulin Ratio: 2.3 — ABNORMAL HIGH (ref 1.2–2.2)
Albumin: 5 g/dL — ABNORMAL HIGH (ref 3.8–4.8)
Alkaline Phosphatase: 91 IU/L (ref 39–117)
BUN/Creatinine Ratio: 20 (ref 12–28)
BUN: 20 mg/dL (ref 8–27)
Bilirubin Total: 0.3 mg/dL (ref 0.0–1.2)
CO2: 24 mmol/L (ref 20–29)
Calcium: 10.2 mg/dL (ref 8.7–10.3)
Chloride: 97 mmol/L (ref 96–106)
Creatinine, Ser: 0.99 mg/dL (ref 0.57–1.00)
GFR calc Af Amer: 68 mL/min/{1.73_m2} (ref >59)
GFR calc non Af Amer: 59 mL/min/{1.73_m2} — ABNORMAL LOW (ref >59)
Globulin, Total: 2.2 g/dL (ref 1.5–4.5)
Glucose: 112 mg/dL — ABNORMAL HIGH (ref 65–99)
Potassium: 4.5 mmol/L (ref 3.5–5.2)
Sodium: 138 mmol/L (ref 134–144)
Total Protein: 7.2 g/dL (ref 6.0–8.5)

## 2020-01-30 LAB — CBC WITH DIFFERENTIAL/PLATELET
Basophils Absolute: 0.1 10*3/uL (ref 0.0–0.2)
Basos: 1 %
EOS (ABSOLUTE): 0.3 10*3/uL (ref 0.0–0.4)
Eos: 3 %
Hematocrit: 40.6 % (ref 34.0–46.6)
Hemoglobin: 13.3 g/dL (ref 11.1–15.9)
Immature Grans (Abs): 0.1 10*3/uL (ref 0.0–0.1)
Immature Granulocytes: 1 %
Lymphocytes Absolute: 2.9 10*3/uL (ref 0.7–3.1)
Lymphs: 28 %
MCH: 29.6 pg (ref 26.6–33.0)
MCHC: 32.8 g/dL (ref 31.5–35.7)
MCV: 90 fL (ref 79–97)
Monocytes Absolute: 0.5 10*3/uL (ref 0.1–0.9)
Monocytes: 5 %
Neutrophils Absolute: 6.5 10*3/uL (ref 1.4–7.0)
Neutrophils: 62 %
Platelets: 492 10*3/uL — ABNORMAL HIGH (ref 150–450)
RBC: 4.49 x10E6/uL (ref 3.77–5.28)
RDW: 12.3 % (ref 11.7–15.4)
WBC: 10.3 10*3/uL (ref 3.4–10.8)

## 2020-01-30 LAB — MICROALBUMIN / CREATININE URINE RATIO
Creatinine, Urine: 43.3 mg/dL
Microalb/Creat Ratio: <7 mg/g creat (ref 0–29)
Microalbumin, Urine: <3 ug/mL

## 2020-01-30 LAB — VITAMIN B12: Vitamin B-12: 633 pg/mL (ref 232–1245)

## 2020-01-30 LAB — LIPID PANEL
Chol/HDL Ratio: 3.9 ratio (ref 0.0–4.4)
Cholesterol, Total: 189 mg/dL (ref 100–199)
HDL: 48 mg/dL (ref >39)
LDL Chol Calc (NIH): 108 mg/dL — ABNORMAL HIGH (ref 0–99)
Triglycerides: 192 mg/dL — ABNORMAL HIGH (ref 0–149)
VLDL Cholesterol Cal: 33 mg/dL (ref 5–40)

## 2020-02-15 DIAGNOSIS — Z23 Encounter for immunization: Secondary | ICD-10-CM | POA: Diagnosis not present

## 2020-02-23 ENCOUNTER — Other Ambulatory Visit: Payer: Self-pay

## 2020-02-26 ENCOUNTER — Other Ambulatory Visit: Payer: Self-pay

## 2020-02-26 ENCOUNTER — Ambulatory Visit (INDEPENDENT_AMBULATORY_CARE_PROVIDER_SITE_OTHER): Payer: Medicare Other

## 2020-02-26 DIAGNOSIS — E538 Deficiency of other specified B group vitamins: Secondary | ICD-10-CM

## 2020-02-26 NOTE — Progress Notes (Signed)
Cyanocobalamin injection given to right deltoid.  Patient tolerated well. 

## 2020-03-15 ENCOUNTER — Other Ambulatory Visit: Payer: Self-pay | Admitting: Nurse Practitioner

## 2020-03-15 DIAGNOSIS — Z23 Encounter for immunization: Secondary | ICD-10-CM | POA: Diagnosis not present

## 2020-03-15 DIAGNOSIS — I10 Essential (primary) hypertension: Secondary | ICD-10-CM

## 2020-03-29 ENCOUNTER — Other Ambulatory Visit: Payer: Self-pay

## 2020-03-29 ENCOUNTER — Ambulatory Visit (INDEPENDENT_AMBULATORY_CARE_PROVIDER_SITE_OTHER): Payer: Medicare Other

## 2020-03-29 DIAGNOSIS — E538 Deficiency of other specified B group vitamins: Secondary | ICD-10-CM | POA: Diagnosis not present

## 2020-03-29 NOTE — Progress Notes (Signed)
Cyanocobalamin injection given to left deltoid.  Patient tolerated well. 

## 2020-04-29 ENCOUNTER — Ambulatory Visit (INDEPENDENT_AMBULATORY_CARE_PROVIDER_SITE_OTHER): Payer: Medicare Other | Admitting: Nurse Practitioner

## 2020-04-29 ENCOUNTER — Encounter: Payer: Self-pay | Admitting: Nurse Practitioner

## 2020-04-29 ENCOUNTER — Other Ambulatory Visit: Payer: Self-pay

## 2020-04-29 VITALS — BP 142/82 | HR 73 | Temp 98.1°F | Resp 20 | Ht 65.0 in | Wt 210.0 lb

## 2020-04-29 DIAGNOSIS — D519 Vitamin B12 deficiency anemia, unspecified: Secondary | ICD-10-CM

## 2020-04-29 DIAGNOSIS — E538 Deficiency of other specified B group vitamins: Secondary | ICD-10-CM

## 2020-04-29 DIAGNOSIS — Z6837 Body mass index (BMI) 37.0-37.9, adult: Secondary | ICD-10-CM

## 2020-04-29 DIAGNOSIS — E119 Type 2 diabetes mellitus without complications: Secondary | ICD-10-CM

## 2020-04-29 DIAGNOSIS — I10 Essential (primary) hypertension: Secondary | ICD-10-CM

## 2020-04-29 DIAGNOSIS — E785 Hyperlipidemia, unspecified: Secondary | ICD-10-CM | POA: Diagnosis not present

## 2020-04-29 LAB — BAYER DCA HB A1C WAIVED: HB A1C (BAYER DCA - WAIVED): 7 % — ABNORMAL HIGH (ref <7.0)

## 2020-04-29 MED ORDER — FENOFIBRATE 160 MG PO TABS: 160.0000 mg | ORAL_TABLET | Freq: Every day | ORAL | 1 refills | Status: DC

## 2020-04-29 MED ORDER — HYDROCHLOROTHIAZIDE 25 MG PO TABS: 25.0000 mg | ORAL_TABLET | Freq: Every day | ORAL | 1 refills | Status: DC

## 2020-04-29 MED ORDER — ATORVASTATIN CALCIUM 40 MG PO TABS: 40.0000 mg | ORAL_TABLET | Freq: Every day | ORAL | 1 refills | Status: DC

## 2020-04-29 MED ORDER — LISINOPRIL 40 MG PO TABS: 40.0000 mg | ORAL_TABLET | Freq: Every day | ORAL | 1 refills | Status: DC

## 2020-04-29 MED ORDER — METFORMIN HCL 1000 MG PO TABS: 1000.0000 mg | ORAL_TABLET | Freq: Two times a day (BID) | ORAL | 1 refills | Status: DC

## 2020-04-29 MED ORDER — GLIMEPIRIDE 2 MG PO TABS: ORAL_TABLET | ORAL | 1 refills | Status: DC

## 2020-04-29 NOTE — Progress Notes (Signed)
Subjective:    Patient ID: Emma Nunez, female    DOB: 1951-03-06, 69 y.o.   MRN: [id number]   Chief Complaint: Medical Management of Chronic Issues    HPI:  1. Type 2 diabetes mellitus without complication, without long-term current use of insulin (HCC) SHe has not really been checking blood sugars or watching diet. Lab Results  Component Value Date   HGBA1C 6.8 01/29/2020     2. Essential hypertension, benign No c/o chest pain, ob or headache. Doe not check bloodpreure at home. BP Readings from Last 3 Encounters:  01/29/20 140/86  10/13/19 137/80  09/07/19 136/82     3. Hyperlipidemia with target LDL less than 100 Does not really watch diet and doe very little exercie. Lab Results  Component Value Date   CHOL 189 01/29/2020   HDL 48 01/29/2020   LDLCALC 108 (H) 01/29/2020   TRIG 192 (H) 01/29/2020   CHOLHDL 3.9 01/29/2020     4. Anemia due to vitamin B12 deficiency, unspecified B12 deficiency type She gets b12 injection 1x a month and she is due for one today  5. BMI 37.0-37.9, adult Weight is up 5 lb since last visit Wt Readings from Last 3 Encounters:  04/29/20 210 lb (95.3 kg)  01/29/20 205 lb (93 kg)  10/13/19 204 lb (92.5 kg)       Outpatient Encounter Medications as of 04/29/2020  Medication Sig  . atorvastatin (LIPITOR) 40 MG tablet Take 1 tablet (40 mg total) by mouth daily at 6 PM.  . Blood Glucose Monitoring Suppl (CONTOUR NEXT MONITOR) w/Device KIT USE AS DIRECTED  . CONTOUR NEXT TEST test strip USE TO CHECK BLOOD SUGAR TWICE DAILY  . fenofibrate 160 MG tablet Take 1 tablet (160 mg total) by mouth daily.  . fluticasone (FLONASE) 50 MCG/ACT nasal spray Place 2 sprays into both nostrils daily. (Patient not taking: Reported on 01/29/2020)  . glimepiride (AMARYL) 2 MG tablet TAKE 1 TABLET BY MOUTH DAILY BEFORE BREAKFAST.  . hydrochlorothiazide (HYDRODIURIL) 25 MG tablet Take 1 tablet (25 mg total) by mouth daily.  Marland Kitchen LANCETS MICRO THIN 33G MISC Use  to check BG once daily.  Dispense whichever goes with meter and that insurance will cover.  Marland Kitchen lisinopril (ZESTRIL) 40 MG tablet TAKE 1 TABLET BY MOUTH EVERY DAY  . metFORMIN (GLUCOPHAGE) 1000 MG tablet Take 1 tablet (1,000 mg total) by mouth 2 (two) times daily with a meal.    Past Surgical History:  Procedure Laterality Date  . ABDOMINAL HYSTERECTOMY    . TONSILLECTOMY AND ADENOIDECTOMY      Family History  Problem Relation Age of Onset  . COPD Father     New complaints: None today  Social history: Lives with husband  Controlled substance contract: n/a     Review of Systems  Constitutional: Negative for diaphoresis.  Eyes: Negative for pain.  Respiratory: Negative for shortness of breath.   Cardiovascular: Negative for chest pain, palpitations and leg swelling.  Gastrointestinal: Negative for abdominal pain.  Endocrine: Negative for polydipsia.  Skin: Negative for rash.  Neurological: Negative for dizziness, weakness and headaches.  Hematological: Does not bruise/bleed easily.  All other systems reviewed and are negative.      Objective:   Physical Exam Vitals and nursing note reviewed.  Constitutional:      General: She is not in acute distress.    Appearance: Normal appearance. She is well-developed.  HENT:     Head: Normocephalic.     Nose:  Nose normal.  Eyes:     Pupils: Pupils are equal, round, and reactive to light.  Neck:     Vascular: No carotid bruit or JVD.  Cardiovascular:     Rate and Rhythm: Normal rate and regular rhythm.     Heart sounds: Normal heart sounds.  Pulmonary:     Effort: Pulmonary effort is normal. No respiratory distress.     Breath sounds: Normal breath sounds. No wheezing or rales.  Chest:     Chest wall: No tenderness.  Abdominal:     General: Bowel sounds are normal. There is no distension or abdominal bruit.     Palpations: Abdomen is soft. There is no hepatomegaly, splenomegaly, mass or pulsatile mass.     Tenderness:  There is no abdominal tenderness.  Musculoskeletal:        General: Normal range of motion.     Cervical back: Normal range of motion and neck supple.  Lymphadenopathy:     Cervical: No cervical adenopathy.  Skin:    General: Skin is warm and dry.  Neurological:     Mental Status: She is alert and oriented to person, place, and time.     Deep Tendon Reflexes: Reflexes are normal and symmetric.  Psychiatric:        Behavior: Behavior normal.        Thought Content: Thought content normal.        Judgment: Judgment normal.     Blood pressure (!) 142/82, pulse 73, temperature 98.1 F (36.7 C), temperature source Temporal, resp. rate 20, height '5\' 5"'  (1.651 m), weight 210 lb (95.3 kg).  hgba1c 7.0%       Assessment & Plan:  Emma Nunez comes in today with chief complaint of Medical Management of Chronic Issues   Diagnosis and orders addressed:  1. Type 2 diabetes mellitus without complication, without long-term current use of insulin (HCC) Watch carb in diet - Bayer DCA Hb A1c Waived - metFORMIN (GLUCOPHAGE) 1000 MG tablet; Take 1 tablet (1,000 mg total) by mouth 2 (two) times daily with a meal.  Dispense: 180 tablet; Refill: 1 - glimepiride (AMARYL) 2 MG tablet; TAKE 1 TABLET BY MOUTH DAILY BEFORE BREAKFAST.  Dispense: 90 tablet; Refill: 1  2. Essential hypertension, benign Low sodium diet - CBC with Differential/Platelet - CMP14+EGFR - lisinopril (ZESTRIL) 40 MG tablet; Take 1 tablet (40 mg total) by mouth daily.  Dispense: 90 tablet; Refill: 1 - hydrochlorothiazide (HYDRODIURIL) 25 MG tablet; Take 1 tablet (25 mg total) by mouth daily.  Dispense: 90 tablet; Refill: 1  3. Hyperlipidemia with target LDL less than 100 Low fat diet - Lipid panel - atorvastatin (LIPITOR) 40 MG tablet; Take 1 tablet (40 mg total) by mouth daily at 6 PM.  Dispense: 90 tablet; Refill: 1 - fenofibrate 160 MG tablet; Take 1 tablet (160 mg total) by mouth daily.  Dispense: 90 tablet; Refill:  1  4. Anemia due to vitamin B12 deficiency, unspecified B12 deficiency type b12 shot today  5. BMI 37.0-37.9, adult Discussed diet and exercise for person with BMI >25 Will recheck weight in 3-6 months    Labs pending Health Maintenance reviewed Diet and exercise encouraged  Follow up plan: 3 months   Gracyn-Margaret Hassell Done, FNP

## 2020-04-29 NOTE — Patient Instructions (Signed)
Carbohydrate Counting for Diabetes Mellitus, Adult  Carbohydrate counting is a method of keeping track of how many carbohydrates you eat. Eating carbohydrates naturally increases the amount of sugar (glucose) in the blood. Counting how many carbohydrates you eat helps keep your blood glucose within normal limits, which helps you manage your diabetes (diabetes mellitus). It is important to know how many carbohydrates you can safely have in each meal. This is different for every person. A diet and nutrition specialist (registered dietitian) can help you make a meal plan and calculate how many carbohydrates you should have at each meal and snack. Carbohydrates are found in the following foods:  Grains, such as breads and cereals.  Dried beans and soy products.  Starchy vegetables, such as potatoes, peas, and corn.  Fruit and fruit juices.  Milk and yogurt.  Sweets and snack foods, such as cake, cookies, candy, chips, and soft drinks. How do I count carbohydrates? There are two ways to count carbohydrates in food. You can use either of the methods or a combination of both. Reading "Nutrition Facts" on packaged food The "Nutrition Facts" list is included on the labels of almost all packaged foods and beverages in the [place] It includes:  The serving size.  Information about nutrients in each serving, including the grams (g) of carbohydrate per serving. To use the "Nutrition Facts":  Decide how many servings you will have.  Multiply the number of servings by the number of carbohydrates per serving.  The resulting number is the total amount of carbohydrates that you will be having. Learning standard serving sizes of other foods When you eat carbohydrate foods that are not packaged or do not include "Nutrition Facts" on the label, you need to measure the servings in order to count the amount of carbohydrates:  Measure the foods that you will eat with a food scale or measuring cup, if  needed.  Decide how many standard-size servings you will eat.  Multiply the number of servings by 15. Most carbohydrate-rich foods have about 15 g of carbohydrates per serving. ? For example, if you eat 8 oz (170 g) of strawberries, you will have eaten 2 servings and 30 g of carbohydrates (2 servings x 15 g = 30 g).  For foods that have more than one food mixed, such as soups and casseroles, you must count the carbohydrates in each food that is included. The following list contains standard serving sizes of common carbohydrate-rich foods. Each of these servings has about 15 g of carbohydrates:   hamburger bun or  English muffin.   oz (15 mL) syrup.   oz (14 g) jelly.  1 slice of bread.  1 six-inch tortilla.  3 oz (85 g) cooked rice or pasta.  4 oz (113 g) cooked dried beans.  4 oz (113 g) starchy vegetable, such as peas, corn, or potatoes.  4 oz (113 g) hot cereal.  4 oz (113 g) mashed potatoes or  of a large baked potato.  4 oz (113 g) canned or frozen fruit.  4 oz (120 mL) fruit juice.  4-6 crackers.  6 chicken nuggets.  6 oz (170 g) unsweetened dry cereal.  6 oz (170 g) plain fat-free yogurt or yogurt sweetened with artificial sweeteners.  8 oz (240 mL) milk.  8 oz (170 g) fresh fruit or one small piece of fruit.  24 oz (680 g) popped popcorn. Example of carbohydrate counting Sample meal  3 oz (85 g) chicken breast.  6 oz (170 g)   brown rice.  4 oz (113 g) corn.  8 oz (240 mL) milk.  8 oz (170 g) strawberries with sugar-free whipped topping. Carbohydrate calculation 1. Identify the foods that contain carbohydrates: ? Rice. ? Corn. ? Milk. ? Strawberries. 2. Calculate how many servings you have of each food: ? 2 servings rice. ? 1 serving corn. ? 1 serving milk. ? 1 serving strawberries. 3. Multiply each number of servings by 15 g: ? 2 servings rice x 15 g = 30 g. ? 1 serving corn x 15 g = 15 g. ? 1 serving milk x 15 g = 15 g. ? 1  serving strawberries x 15 g = 15 g. 4. Add together all of the amounts to find the total grams of carbohydrates eaten: ? 30 g + 15 g + 15 g + 15 g = 75 g of carbohydrates total. Summary  Carbohydrate counting is a method of keeping track of how many carbohydrates you eat.  Eating carbohydrates naturally increases the amount of sugar (glucose) in the blood.  Counting how many carbohydrates you eat helps keep your blood glucose within normal limits, which helps you manage your diabetes.  A diet and nutrition specialist (registered dietitian) can help you make a meal plan and calculate how many carbohydrates you should have at each meal and snack. This information is not intended to replace advice given to you by your health care provider. Make sure you discuss any questions you have with your health care provider. Document Revised: 07/01/2017 Document Reviewed: 05/20/2016 Elsevier Patient Education  2020 Elsevier Inc.  

## 2020-04-30 LAB — CMP14+EGFR
ALT: 37 IU/L — ABNORMAL HIGH (ref 0–32)
AST: 28 IU/L (ref 0–40)
Albumin/Globulin Ratio: 1.9 (ref 1.2–2.2)
Albumin: 4.4 g/dL (ref 3.8–4.8)
Alkaline Phosphatase: 98 IU/L (ref 39–117)
BUN/Creatinine Ratio: 17 (ref 12–28)
BUN: 14 mg/dL (ref 8–27)
Bilirubin Total: 0.2 mg/dL (ref 0.0–1.2)
CO2: 24 mmol/L (ref 20–29)
Calcium: 9.6 mg/dL (ref 8.7–10.3)
Chloride: 101 mmol/L (ref 96–106)
Creatinine, Ser: 0.82 mg/dL (ref 0.57–1.00)
GFR calc Af Amer: 85 mL/min/{1.73_m2} (ref >59)
GFR calc non Af Amer: 74 mL/min/{1.73_m2} (ref >59)
Globulin, Total: 2.3 g/dL (ref 1.5–4.5)
Glucose: 125 mg/dL — ABNORMAL HIGH (ref 65–99)
Potassium: 4.1 mmol/L (ref 3.5–5.2)
Sodium: 141 mmol/L (ref 134–144)
Total Protein: 6.7 g/dL (ref 6.0–8.5)

## 2020-04-30 LAB — CBC WITH DIFFERENTIAL/PLATELET
Basophils Absolute: 0.1 10*3/uL (ref 0.0–0.2)
Basos: 1 %
EOS (ABSOLUTE): 0.3 10*3/uL (ref 0.0–0.4)
Eos: 4 %
Hematocrit: 38.7 % (ref 34.0–46.6)
Hemoglobin: 12.6 g/dL (ref 11.1–15.9)
Immature Grans (Abs): 0 10*3/uL (ref 0.0–0.1)
Immature Granulocytes: 0 %
Lymphocytes Absolute: 2.6 10*3/uL (ref 0.7–3.1)
Lymphs: 29 %
MCH: 29 pg (ref 26.6–33.0)
MCHC: 32.6 g/dL (ref 31.5–35.7)
MCV: 89 fL (ref 79–97)
Monocytes Absolute: 0.5 10*3/uL (ref 0.1–0.9)
Monocytes: 6 %
Neutrophils Absolute: 5.5 10*3/uL (ref 1.4–7.0)
Neutrophils: 60 %
Platelets: 468 10*3/uL — ABNORMAL HIGH (ref 150–450)
RBC: 4.34 x10E6/uL (ref 3.77–5.28)
RDW: 12.7 % (ref 11.7–15.4)
WBC: 9 10*3/uL (ref 3.4–10.8)

## 2020-04-30 LAB — LIPID PANEL
Chol/HDL Ratio: 4.1 ratio (ref 0.0–4.4)
Cholesterol, Total: 179 mg/dL (ref 100–199)
HDL: 44 mg/dL (ref >39)
LDL Chol Calc (NIH): 96 mg/dL (ref 0–99)
Triglycerides: 233 mg/dL — ABNORMAL HIGH (ref 0–149)
VLDL Cholesterol Cal: 39 mg/dL (ref 5–40)

## 2020-05-30 ENCOUNTER — Ambulatory Visit (INDEPENDENT_AMBULATORY_CARE_PROVIDER_SITE_OTHER): Payer: Medicare Other

## 2020-05-30 ENCOUNTER — Other Ambulatory Visit: Payer: Self-pay

## 2020-05-30 DIAGNOSIS — E538 Deficiency of other specified B group vitamins: Secondary | ICD-10-CM

## 2020-05-30 NOTE — Progress Notes (Signed)
B12 injection given in left deltoid today. Pt tolerated well. Next months appt scheduled.

## 2020-07-01 ENCOUNTER — Other Ambulatory Visit: Payer: Self-pay

## 2020-07-01 ENCOUNTER — Ambulatory Visit (INDEPENDENT_AMBULATORY_CARE_PROVIDER_SITE_OTHER): Payer: Medicare Other

## 2020-07-01 DIAGNOSIS — E538 Deficiency of other specified B group vitamins: Secondary | ICD-10-CM | POA: Diagnosis not present

## 2020-08-01 ENCOUNTER — Ambulatory Visit: Payer: Self-pay | Admitting: Nurse Practitioner

## 2020-08-05 ENCOUNTER — Other Ambulatory Visit: Payer: Self-pay

## 2020-08-05 ENCOUNTER — Ambulatory Visit (INDEPENDENT_AMBULATORY_CARE_PROVIDER_SITE_OTHER): Payer: Medicare Other | Admitting: Family Medicine

## 2020-08-05 DIAGNOSIS — E538 Deficiency of other specified B group vitamins: Secondary | ICD-10-CM | POA: Diagnosis not present

## 2020-08-07 LAB — HM DIABETES EYE EXAM

## 2020-08-23 ENCOUNTER — Other Ambulatory Visit: Payer: Self-pay

## 2020-08-23 ENCOUNTER — Ambulatory Visit (INDEPENDENT_AMBULATORY_CARE_PROVIDER_SITE_OTHER): Payer: Medicare Other | Admitting: Nurse Practitioner

## 2020-08-23 ENCOUNTER — Encounter: Payer: Self-pay | Admitting: Nurse Practitioner

## 2020-08-23 VITALS — BP 136/82 | HR 72 | Temp 98.0°F | Resp 20 | Ht 65.0 in | Wt 208.0 lb

## 2020-08-23 DIAGNOSIS — D510 Vitamin B12 deficiency anemia due to intrinsic factor deficiency: Secondary | ICD-10-CM | POA: Diagnosis not present

## 2020-08-23 DIAGNOSIS — E119 Type 2 diabetes mellitus without complications: Secondary | ICD-10-CM

## 2020-08-23 DIAGNOSIS — Z6837 Body mass index (BMI) 37.0-37.9, adult: Secondary | ICD-10-CM | POA: Diagnosis not present

## 2020-08-23 DIAGNOSIS — I10 Essential (primary) hypertension: Secondary | ICD-10-CM | POA: Diagnosis not present

## 2020-08-23 DIAGNOSIS — E785 Hyperlipidemia, unspecified: Secondary | ICD-10-CM | POA: Diagnosis not present

## 2020-08-23 LAB — BAYER DCA HB A1C WAIVED: HB A1C (BAYER DCA - WAIVED): 7.2 % — ABNORMAL HIGH (ref <7.0)

## 2020-08-23 MED ORDER — GLIMEPIRIDE 2 MG PO TABS: ORAL_TABLET | ORAL | 1 refills | Status: DC

## 2020-08-23 MED ORDER — METFORMIN HCL 1000 MG PO TABS: 1000.0000 mg | ORAL_TABLET | Freq: Two times a day (BID) | ORAL | 1 refills | Status: DC

## 2020-08-23 MED ORDER — ATORVASTATIN CALCIUM 40 MG PO TABS: 40.0000 mg | ORAL_TABLET | Freq: Every day | ORAL | 1 refills | Status: DC

## 2020-08-23 MED ORDER — HYDROCHLOROTHIAZIDE 25 MG PO TABS: 25.0000 mg | ORAL_TABLET | Freq: Every day | ORAL | 1 refills | Status: DC

## 2020-08-23 MED ORDER — FENOFIBRATE 160 MG PO TABS: 160.0000 mg | ORAL_TABLET | Freq: Every day | ORAL | 1 refills | Status: DC

## 2020-08-23 MED ORDER — LISINOPRIL 40 MG PO TABS: 40.0000 mg | ORAL_TABLET | Freq: Every day | ORAL | 1 refills | Status: DC

## 2020-08-23 NOTE — Progress Notes (Signed)
Subjective:    Patient ID: Emma Nunez, female    DOB: 05-31-1951, 69 y.o.   MRN: [id number]   Chief Complaint: Medical Management of Chronic Issues    HPI:  1. Type 2 diabetes mellitus without complication, without long-term current use of insulin ([place]) Patient does not check blood sugars as often as should. Denies any symptoms of low blood sugars. She says she has really not been watching diet at all the last several months. Lab Results  Component Value Date   HGBA1C 7.0 (H) 04/29/2020     2. Essential hypertension, benign No c/o chest pain, sob or headache. Does not heck blood pressure at home. BP Readings from Last 3 Encounters:  08/23/20 (!) 150/87  04/29/20 (!) 142/82  01/29/20 140/86     3. Hyperlipidemia with target LDL less than 100 Does not watch diet and does little to no exercise. Lab Results  Component Value Date   CHOL 179 04/29/2020   HDL 44 04/29/2020   LDLCALC 96 04/29/2020   TRIG 233 (H) 04/29/2020   CHOLHDL 4.1 04/29/2020     4. BMI 37.0-37.9, adult No recent weight changes Wt Readings from Last 3 Encounters:  08/23/20 208 lb (94.3 kg)  04/29/20 210 lb (95.3 kg)  01/29/20 205 lb (93 kg)   BMI Readings from Last 3 Encounters:  08/23/20 34.61 kg/m  04/29/20 34.95 kg/m  01/29/20 34.11 kg/m     5. Vitamin B12 deficiency anemia due to intrinsic factor deficiency Gets b12 injections monthly    Outpatient Encounter Medications as of 08/23/2020  Medication Sig  . atorvastatin (LIPITOR) 40 MG tablet Take 1 tablet (40 mg total) by mouth daily at 6 PM.  . Blood Glucose Monitoring Suppl (CONTOUR NEXT MONITOR) w/Device KIT USE AS DIRECTED  . CONTOUR NEXT TEST test strip USE TO CHECK BLOOD SUGAR TWICE DAILY  . fenofibrate 160 MG tablet Take 1 tablet (160 mg total) by mouth daily.  . fluticasone (FLONASE) 50 MCG/ACT nasal spray Place 2 sprays into both nostrils daily.  Marland Kitchen glimepiride (AMARYL) 2 MG tablet TAKE 1 TABLET BY MOUTH DAILY BEFORE  BREAKFAST.  . hydrochlorothiazide (HYDRODIURIL) 25 MG tablet Take 1 tablet (25 mg total) by mouth daily.  Marland Kitchen LANCETS MICRO THIN 33G MISC Use to check BG once daily.  Dispense whichever goes with meter and that insurance will cover.  Marland Kitchen lisinopril (ZESTRIL) 40 MG tablet Take 1 tablet (40 mg total) by mouth daily.  . metFORMIN (GLUCOPHAGE) 1000 MG tablet Take 1 tablet (1,000 mg total) by mouth 2 (two) times daily with a meal.     Past Surgical History:  Procedure Laterality Date  . ABDOMINAL HYSTERECTOMY    . TONSILLECTOMY AND ADENOIDECTOMY      Family History  Problem Relation Age of Onset  . COPD Father     New complaints: None today  Social history: Lives by herself- husband died many years ago. She has family that lives near by.  Controlled substance contract: n/a    Review of Systems  Constitutional: Negative for diaphoresis.  Eyes: Negative for pain.  Respiratory: Negative for shortness of breath.   Cardiovascular: Negative for chest pain, palpitations and leg swelling.  Gastrointestinal: Negative for abdominal pain.  Endocrine: Negative for polydipsia.  Skin: Negative for rash.  Neurological: Negative for dizziness, weakness and headaches.  Hematological: Does not bruise/bleed easily.  All other systems reviewed and are negative.      Objective:   Physical Exam Vitals and nursing note  reviewed.  Constitutional:      General: She is not in acute distress.    Appearance: Normal appearance. She is well-developed.  HENT:     Head: Normocephalic.     Nose: Nose normal.  Eyes:     Pupils: Pupils are equal, round, and reactive to light.  Neck:     Vascular: No carotid bruit or JVD.  Cardiovascular:     Rate and Rhythm: Normal rate and regular rhythm.     Heart sounds: Normal heart sounds.  Pulmonary:     Effort: Pulmonary effort is normal. No respiratory distress.     Breath sounds: Normal breath sounds. No wheezing or rales.  Chest:     Chest wall: No  tenderness.  Abdominal:     General: Bowel sounds are normal. There is no distension or abdominal bruit.     Palpations: Abdomen is soft. There is no hepatomegaly, splenomegaly, mass or pulsatile mass.     Tenderness: There is no abdominal tenderness.  Musculoskeletal:        General: Normal range of motion.     Cervical back: Normal range of motion and neck supple.  Lymphadenopathy:     Cervical: No cervical adenopathy.  Skin:    General: Skin is warm and dry.  Neurological:     Mental Status: She is alert and oriented to person, place, and time.     Deep Tendon Reflexes: Reflexes are normal and symmetric.  Psychiatric:        Behavior: Behavior normal.        Thought Content: Thought content normal.        Judgment: Judgment normal.    BP 136/82 (BP Location: Left Arm, Cuff Size: Normal)   Pulse 72   Temp 98 F (36.7 C) (Temporal)   Resp 20   Ht '5\' 5"'  (1.651 m)   Wt 208 lb (94.3 kg)   SpO2 98%   BMI 34.61 kg/m    hgba1c 7.2%       Assessment & Plan:  Emma Nunez comes in today with chief complaint of Medical Management of Chronic Issues   Diagnosis and orders addressed:  1. Type 2 diabetes mellitus without complication, without long-term current use of insulin (HCC) Continue to watch carbs in diet - Bayer DCA Hb A1c Waived - Microalbumin / creatinine urine ratio - metFORMIN (GLUCOPHAGE) 1000 MG tablet; Take 1 tablet (1,000 mg total) by mouth 2 (two) times daily with a meal.  Dispense: 180 tablet; Refill: 1 - glimepiride (AMARYL) 2 MG tablet; TAKE 1 TABLET BY MOUTH DAILY BEFORE BREAKFAST.  Dispense: 90 tablet; Refill: 1  2. Essential hypertension, benign Low sodium diet - CBC with Differential/Platelet - CMP14+EGFR - lisinopril (ZESTRIL) 40 MG tablet; Take 1 tablet (40 mg total) by mouth daily.  Dispense: 90 tablet; Refill: 1 - hydrochlorothiazide (HYDRODIURIL) 25 MG tablet; Take 1 tablet (25 mg total) by mouth daily.  Dispense: 90 tablet; Refill: 1  3.  Hyperlipidemia with target LDL less than 100 Low fat diet - Lipid panel - atorvastatin (LIPITOR) 40 MG tablet; Take 1 tablet (40 mg total) by mouth daily at 6 PM.  Dispense: 90 tablet; Refill: 1 - fenofibrate 160 MG tablet; Take 1 tablet (160 mg total) by mouth daily.  Dispense: 90 tablet; Refill: 1  4. BMI 37.0-37.9, adult Discussed diet and exercise for person with BMI >25 Will recheck weight in 3-6 months   5. Vitamin B12 deficiency anemia due to intrinsic factor deficiency Continue  monthly vitamin b12 vaccine   Labs pending Health Maintenance reviewed- please do cologuard that she has at home. Diet and exercise encouraged  Follow up plan: 3 months   Martesha-Margaret Hassell Done, FNP

## 2020-08-23 NOTE — Patient Instructions (Signed)
Diabetes Mellitus and Foot Care Foot care is an important part of your health, especially when you have diabetes. Diabetes may cause you to have problems because of poor blood flow (circulation) to your feet and legs, which can cause your skin to:  Become thinner and drier.  Break more easily.  Heal more slowly.  Peel and crack. You may also have nerve damage (neuropathy) in your legs and feet, causing decreased feeling in them. This means that you may not notice minor injuries to your feet that could lead to more serious problems. Noticing and addressing any potential problems early is the best way to prevent future foot problems. How to care for your feet Foot hygiene  Wash your feet daily with warm water and mild soap. Do not use hot water. Then, pat your feet and the areas between your toes until they are completely dry. Do not soak your feet as this can dry your skin.  Trim your toenails straight across. Do not dig under them or around the cuticle. File the edges of your nails with an emery board or nail file.  Apply a moisturizing lotion or petroleum jelly to the skin on your feet and to dry, brittle toenails. Use lotion that does not contain alcohol and is unscented. Do not apply lotion between your toes. Shoes and socks  Wear clean socks or stockings every day. Make sure they are not too tight. Do not wear knee-high stockings since they may decrease blood flow to your legs.  Wear shoes that fit properly and have enough cushioning. Always look in your shoes before you put them on to be sure there are no objects inside.  To break in new shoes, wear them for just a few hours a day. This prevents injuries on your feet. Wounds, scrapes, corns, and calluses  Check your feet daily for blisters, cuts, bruises, sores, and redness. If you cannot see the bottom of your feet, use a mirror or ask someone for help.  Do not cut corns or calluses or try to remove them with medicine.  If you  find a minor scrape, cut, or break in the skin on your feet, keep it and the skin around it clean and dry. You may clean these areas with mild soap and water. Do not clean the area with peroxide, alcohol, or iodine.  If you have a wound, scrape, corn, or callus on your foot, look at it several times a day to make sure it is healing and not infected. Check for: ? Redness, swelling, or pain. ? Fluid or blood. ? Warmth. ? Pus or a bad smell. General instructions  Do not cross your legs. This may decrease blood flow to your feet.  Do not use heating pads or hot water bottles on your feet. They may burn your skin. If you have lost feeling in your feet or legs, you may not know this is happening until it is too late.  Protect your feet from hot and cold by wearing shoes, such as at the beach or on hot pavement.  Schedule a complete foot exam at least once a year (annually) or more often if you have foot problems. If you have foot problems, report any cuts, sores, or bruises to your health care provider immediately. Contact a health care provider if:  You have a medical condition that increases your risk of infection and you have any cuts, sores, or bruises on your feet.  You have an injury that is not   healing.  You have redness on your legs or feet.  You feel burning or tingling in your legs or feet.  You have pain or cramps in your legs and feet.  Your legs or feet are numb.  Your feet always feel cold.  You have pain around a toenail. Get help right away if:  You have a wound, scrape, corn, or callus on your foot and: ? You have pain, swelling, or redness that gets worse. ? You have fluid or blood coming from the wound, scrape, corn, or callus. ? Your wound, scrape, corn, or callus feels warm to the touch. ? You have pus or a bad smell coming from the wound, scrape, corn, or callus. ? You have a fever. ? You have a red line going up your leg. Summary  Check your feet every day  for cuts, sores, red spots, swelling, and blisters.  Moisturize feet and legs daily.  Wear shoes that fit properly and have enough cushioning.  If you have foot problems, report any cuts, sores, or bruises to your health care provider immediately.  Schedule a complete foot exam at least once a year (annually) or more often if you have foot problems. This information is not intended to replace advice given to you by your health care provider. Make sure you discuss any questions you have with your health care provider. Document Revised: 08/30/2019 Document Reviewed: 01/08/2017 Elsevier Patient Education  2020 [employer] Inc.  

## 2020-08-24 LAB — CMP14+EGFR
ALT: 40 IU/L — ABNORMAL HIGH (ref 0–32)
AST: 32 IU/L (ref 0–40)
Albumin/Globulin Ratio: 1.8 (ref 1.2–2.2)
Albumin: 4.4 g/dL (ref 3.8–4.8)
Alkaline Phosphatase: 111 IU/L (ref 48–121)
BUN/Creatinine Ratio: 16 (ref 12–28)
BUN: 16 mg/dL (ref 8–27)
Bilirubin Total: 0.2 mg/dL (ref 0.0–1.2)
CO2: 25 mmol/L (ref 20–29)
Calcium: 9.8 mg/dL (ref 8.7–10.3)
Chloride: 99 mmol/L (ref 96–106)
Creatinine, Ser: 0.99 mg/dL (ref 0.57–1.00)
GFR calc Af Amer: 68 mL/min/{1.73_m2} (ref >59)
GFR calc non Af Amer: 59 mL/min/{1.73_m2} — ABNORMAL LOW (ref >59)
Globulin, Total: 2.5 g/dL (ref 1.5–4.5)
Glucose: 118 mg/dL — ABNORMAL HIGH (ref 65–99)
Potassium: 4.8 mmol/L (ref 3.5–5.2)
Sodium: 140 mmol/L (ref 134–144)
Total Protein: 6.9 g/dL (ref 6.0–8.5)

## 2020-08-24 LAB — CBC WITH DIFFERENTIAL/PLATELET
Basophils Absolute: 0.1 10*3/uL (ref 0.0–0.2)
Basos: 1 %
EOS (ABSOLUTE): 0.4 10*3/uL (ref 0.0–0.4)
Eos: 4 %
Hematocrit: 40.2 % (ref 34.0–46.6)
Hemoglobin: 13.1 g/dL (ref 11.1–15.9)
Immature Grans (Abs): 0.1 10*3/uL (ref 0.0–0.1)
Immature Granulocytes: 1 %
Lymphocytes Absolute: 3.1 10*3/uL (ref 0.7–3.1)
Lymphs: 32 %
MCH: 29 pg (ref 26.6–33.0)
MCHC: 32.6 g/dL (ref 31.5–35.7)
MCV: 89 fL (ref 79–97)
Monocytes Absolute: 0.5 10*3/uL (ref 0.1–0.9)
Monocytes: 6 %
Neutrophils Absolute: 5.4 10*3/uL (ref 1.4–7.0)
Neutrophils: 56 %
Platelets: 482 10*3/uL — ABNORMAL HIGH (ref 150–450)
RBC: 4.51 x10E6/uL (ref 3.77–5.28)
RDW: 13 % (ref 11.7–15.4)
WBC: 9.5 10*3/uL (ref 3.4–10.8)

## 2020-08-24 LAB — LIPID PANEL
Chol/HDL Ratio: 4.5 ratio — ABNORMAL HIGH (ref 0.0–4.4)
Cholesterol, Total: 194 mg/dL (ref 100–199)
HDL: 43 mg/dL (ref >39)
LDL Chol Calc (NIH): 118 mg/dL — ABNORMAL HIGH (ref 0–99)
Triglycerides: 189 mg/dL — ABNORMAL HIGH (ref 0–149)
VLDL Cholesterol Cal: 33 mg/dL (ref 5–40)

## 2020-09-05 ENCOUNTER — Other Ambulatory Visit: Payer: Self-pay

## 2020-09-05 ENCOUNTER — Ambulatory Visit (INDEPENDENT_AMBULATORY_CARE_PROVIDER_SITE_OTHER): Payer: Medicare Other

## 2020-09-05 DIAGNOSIS — E538 Deficiency of other specified B group vitamins: Secondary | ICD-10-CM

## 2020-09-05 DIAGNOSIS — Z23 Encounter for immunization: Secondary | ICD-10-CM

## 2020-09-05 DIAGNOSIS — D510 Vitamin B12 deficiency anemia due to intrinsic factor deficiency: Secondary | ICD-10-CM

## 2020-10-07 ENCOUNTER — Other Ambulatory Visit: Payer: Self-pay

## 2020-10-07 ENCOUNTER — Ambulatory Visit (INDEPENDENT_AMBULATORY_CARE_PROVIDER_SITE_OTHER): Payer: Medicare Other | Admitting: *Deleted

## 2020-10-07 DIAGNOSIS — D510 Vitamin B12 deficiency anemia due to intrinsic factor deficiency: Secondary | ICD-10-CM

## 2020-10-07 DIAGNOSIS — E538 Deficiency of other specified B group vitamins: Secondary | ICD-10-CM

## 2020-10-07 NOTE — Progress Notes (Signed)
Patient in today for monthly B 12 injection. 1000 mcg given IM in left deltoid. Patient tolerated well.  

## 2020-11-08 ENCOUNTER — Ambulatory Visit: Payer: Medicare Other

## 2020-11-19 ENCOUNTER — Other Ambulatory Visit: Payer: Self-pay

## 2020-11-19 ENCOUNTER — Ambulatory Visit (INDEPENDENT_AMBULATORY_CARE_PROVIDER_SITE_OTHER): Payer: Medicare Other

## 2020-11-19 DIAGNOSIS — E538 Deficiency of other specified B group vitamins: Secondary | ICD-10-CM

## 2020-11-19 DIAGNOSIS — D510 Vitamin B12 deficiency anemia due to intrinsic factor deficiency: Secondary | ICD-10-CM

## 2020-11-19 NOTE — Progress Notes (Signed)
B12 given and tolerated well °

## 2020-11-20 DIAGNOSIS — Z23 Encounter for immunization: Secondary | ICD-10-CM | POA: Diagnosis not present

## 2020-11-25 ENCOUNTER — Ambulatory Visit: Payer: Self-pay | Admitting: Nurse Practitioner

## 2020-12-19 ENCOUNTER — Ambulatory Visit (INDEPENDENT_AMBULATORY_CARE_PROVIDER_SITE_OTHER): Payer: Medicare Other | Admitting: Family Medicine

## 2020-12-19 ENCOUNTER — Other Ambulatory Visit: Payer: Self-pay

## 2020-12-19 DIAGNOSIS — D510 Vitamin B12 deficiency anemia due to intrinsic factor deficiency: Secondary | ICD-10-CM

## 2020-12-19 DIAGNOSIS — E538 Deficiency of other specified B group vitamins: Secondary | ICD-10-CM | POA: Diagnosis not present

## 2021-01-20 ENCOUNTER — Ambulatory Visit (INDEPENDENT_AMBULATORY_CARE_PROVIDER_SITE_OTHER): Payer: Medicare Other | Admitting: *Deleted

## 2021-01-20 ENCOUNTER — Other Ambulatory Visit: Payer: Self-pay

## 2021-01-20 DIAGNOSIS — E538 Deficiency of other specified B group vitamins: Secondary | ICD-10-CM

## 2021-01-20 DIAGNOSIS — D510 Vitamin B12 deficiency anemia due to intrinsic factor deficiency: Secondary | ICD-10-CM

## 2021-02-18 ENCOUNTER — Other Ambulatory Visit: Payer: Self-pay

## 2021-02-18 ENCOUNTER — Ambulatory Visit (INDEPENDENT_AMBULATORY_CARE_PROVIDER_SITE_OTHER): Payer: Medicare Other | Admitting: *Deleted

## 2021-02-18 DIAGNOSIS — E538 Deficiency of other specified B group vitamins: Secondary | ICD-10-CM | POA: Diagnosis not present

## 2021-02-18 DIAGNOSIS — D510 Vitamin B12 deficiency anemia due to intrinsic factor deficiency: Secondary | ICD-10-CM

## 2021-03-08 ENCOUNTER — Other Ambulatory Visit: Payer: Self-pay | Admitting: Nurse Practitioner

## 2021-03-08 DIAGNOSIS — I10 Essential (primary) hypertension: Secondary | ICD-10-CM

## 2021-03-08 DIAGNOSIS — E785 Hyperlipidemia, unspecified: Secondary | ICD-10-CM

## 2021-03-19 ENCOUNTER — Other Ambulatory Visit: Payer: Self-pay

## 2021-03-19 ENCOUNTER — Ambulatory Visit (INDEPENDENT_AMBULATORY_CARE_PROVIDER_SITE_OTHER): Payer: BC Managed Care – PPO

## 2021-03-19 DIAGNOSIS — E538 Deficiency of other specified B group vitamins: Secondary | ICD-10-CM | POA: Diagnosis not present

## 2021-03-19 DIAGNOSIS — D510 Vitamin B12 deficiency anemia due to intrinsic factor deficiency: Secondary | ICD-10-CM

## 2021-03-24 ENCOUNTER — Other Ambulatory Visit: Payer: Self-pay | Admitting: Nurse Practitioner

## 2021-03-24 DIAGNOSIS — E119 Type 2 diabetes mellitus without complications: Secondary | ICD-10-CM

## 2021-04-02 ENCOUNTER — Other Ambulatory Visit: Payer: Self-pay | Admitting: Nurse Practitioner

## 2021-04-02 DIAGNOSIS — E785 Hyperlipidemia, unspecified: Secondary | ICD-10-CM

## 2021-04-02 DIAGNOSIS — I10 Essential (primary) hypertension: Secondary | ICD-10-CM

## 2021-04-02 NOTE — Telephone Encounter (Signed)
MMM NTBS 30 days given 03/10/21

## 2021-04-10 ENCOUNTER — Encounter: Payer: Self-pay | Admitting: Nurse Practitioner

## 2021-04-10 ENCOUNTER — Ambulatory Visit (INDEPENDENT_AMBULATORY_CARE_PROVIDER_SITE_OTHER): Payer: BC Managed Care – PPO | Admitting: Nurse Practitioner

## 2021-04-10 ENCOUNTER — Other Ambulatory Visit: Payer: Self-pay

## 2021-04-10 VITALS — BP 136/86 | HR 79 | Temp 98.3°F | Resp 20 | Ht 65.0 in | Wt 198.0 lb

## 2021-04-10 DIAGNOSIS — E119 Type 2 diabetes mellitus without complications: Secondary | ICD-10-CM | POA: Diagnosis not present

## 2021-04-10 DIAGNOSIS — Z6832 Body mass index (BMI) 32.0-32.9, adult: Secondary | ICD-10-CM

## 2021-04-10 DIAGNOSIS — H6123 Impacted cerumen, bilateral: Secondary | ICD-10-CM | POA: Diagnosis not present

## 2021-04-10 DIAGNOSIS — I10 Essential (primary) hypertension: Secondary | ICD-10-CM | POA: Diagnosis not present

## 2021-04-10 DIAGNOSIS — E785 Hyperlipidemia, unspecified: Secondary | ICD-10-CM | POA: Diagnosis not present

## 2021-04-10 DIAGNOSIS — D510 Vitamin B12 deficiency anemia due to intrinsic factor deficiency: Secondary | ICD-10-CM

## 2021-04-10 LAB — BAYER DCA HB A1C WAIVED: HB A1C (BAYER DCA - WAIVED): 6.3 % (ref <7.0)

## 2021-04-10 MED ORDER — ATORVASTATIN CALCIUM 40 MG PO TABS: 40.0000 mg | ORAL_TABLET | Freq: Every day | ORAL | 1 refills | Status: DC

## 2021-04-10 MED ORDER — METFORMIN HCL 1000 MG PO TABS: 1000.0000 mg | ORAL_TABLET | Freq: Two times a day (BID) | ORAL | 1 refills | Status: DC

## 2021-04-10 MED ORDER — FENOFIBRATE 160 MG PO TABS: 160.0000 mg | ORAL_TABLET | Freq: Every day | ORAL | 1 refills | Status: DC

## 2021-04-10 MED ORDER — HYDROCHLOROTHIAZIDE 25 MG PO TABS: 25.0000 mg | ORAL_TABLET | Freq: Every day | ORAL | 1 refills | Status: DC

## 2021-04-10 MED ORDER — LISINOPRIL 40 MG PO TABS: 40.0000 mg | ORAL_TABLET | Freq: Every day | ORAL | 1 refills | Status: DC

## 2021-04-10 MED ORDER — GLIMEPIRIDE 2 MG PO TABS: ORAL_TABLET | ORAL | 1 refills | Status: DC

## 2021-04-10 MED ORDER — BLOOD GLUCOSE METER KIT: PACK | 0 refills | Status: DC

## 2021-04-10 NOTE — Patient Instructions (Signed)
Diabetes Mellitus and Foot Care Foot care is an important part of your health, especially when you have diabetes. Diabetes may cause you to have problems because of poor blood flow (circulation) to your feet and legs, which can cause your skin to:  Become thinner and drier.  Break more easily.  Heal more slowly.  Peel and crack. You may also have nerve damage (neuropathy) in your legs and feet, causing decreased feeling in them. This means that you may not notice minor injuries to your feet that could lead to more serious problems. Noticing and addressing any potential problems early is the best way to prevent future foot problems. How to care for your feet Foot hygiene  Wash your feet daily with warm water and mild soap. Do not use hot water. Then, pat your feet and the areas between your toes until they are completely dry. Do not soak your feet as this can dry your skin.  Trim your toenails straight across. Do not dig under them or around the cuticle. File the edges of your nails with an emery board or nail file.  Apply a moisturizing lotion or petroleum jelly to the skin on your feet and to dry, brittle toenails. Use lotion that does not contain alcohol and is unscented. Do not apply lotion between your toes.   Shoes and socks  Wear clean socks or stockings every day. Make sure they are not too tight. Do not wear knee-high stockings since they may decrease blood flow to your legs.  Wear shoes that fit properly and have enough cushioning. Always look in your shoes before you put them on to be sure there are no objects inside.  To break in new shoes, wear them for just a few hours a day. This prevents injuries on your feet. Wounds, scrapes, corns, and calluses  Check your feet daily for blisters, cuts, bruises, sores, and redness. If you cannot see the bottom of your feet, use a mirror or ask someone for help.  Do not cut corns or calluses or try to remove them with medicine.  If you  find a minor scrape, cut, or break in the skin on your feet, keep it and the skin around it clean and dry. You may clean these areas with mild soap and water. Do not clean the area with peroxide, alcohol, or iodine.  If you have a wound, scrape, corn, or callus on your foot, look at it several times a day to make sure it is healing and not infected. Check for: ? Redness, swelling, or pain. ? Fluid or blood. ? Warmth. ? Pus or a bad smell.   General tips  Do not cross your legs. This may decrease blood flow to your feet.  Do not use heating pads or hot water bottles on your feet. They may burn your skin. If you have lost feeling in your feet or legs, you may not know this is happening until it is too late.  Protect your feet from hot and cold by wearing shoes, such as at the beach or on hot pavement.  Schedule a complete foot exam at least once a year (annually) or more often if you have foot problems. Report any cuts, sores, or bruises to your health care provider immediately. Where to find more information  American Diabetes Association: www.diabetes.org  Association of Diabetes Care & Education Specialists: www.diabeteseducator.org Contact a health care provider if:  You have a medical condition that increases your risk of infection and   you have any cuts, sores, or bruises on your feet.  You have an injury that is not healing.  You have redness on your legs or feet.  You feel burning or tingling in your legs or feet.  You have pain or cramps in your legs and feet.  Your legs or feet are numb.  Your feet always feel cold.  You have pain around any toenails. Get help right away if:  You have a wound, scrape, corn, or callus on your foot and: ? You have pain, swelling, or redness that gets worse. ? You have fluid or blood coming from the wound, scrape, corn, or callus. ? Your wound, scrape, corn, or callus feels warm to the touch. ? You have pus or a bad smell coming from  the wound, scrape, corn, or callus. ? You have a fever. ? You have a red line going up your leg. Summary  Check your feet every day for blisters, cuts, bruises, sores, and redness.  Apply a moisturizing lotion or petroleum jelly to the skin on your feet and to dry, brittle toenails.  Wear shoes that fit properly and have enough cushioning.  If you have foot problems, report any cuts, sores, or bruises to your health care provider immediately.  Schedule a complete foot exam at least once a year (annually) or more often if you have foot problems. This information is not intended to replace advice given to you by your health care provider. Make sure you discuss any questions you have with your health care provider. Document Revised: 06/27/2020 Document Reviewed: 06/27/2020 Elsevier Patient Education  2021 [employer] Inc.  

## 2021-04-10 NOTE — Progress Notes (Signed)
Subjective:    Patient ID: Emma Nunez, female    DOB: [birth year], 70 y.o.   MRN: [id number]   Chief Complaint: medical management of chronic issues     HPI:  1. Essential hypertension, benign No c/o chest pain, sob or headache. Does not check blood pressure at home. BP Readings from Last 3 Encounters:  08/23/20 136/82  04/29/20 (!) 142/82  01/29/20 140/86     2. Hyperlipidemia with target LDL less than 100 Does not watch diet and does no dedicated exercise.Takes lipitor daily. Lab Results  Component Value Date   CHOL 194 08/23/2020   HDL 43 08/23/2020   LDLCALC 118 (H) 08/23/2020   TRIG 189 (H) 08/23/2020   CHOLHDL 4.5 (H) 08/23/2020   The 10-year ASCVD risk score Emma Nunez., et al., 2013) is: 24.2%   3. Type 2 diabetes mellitus without complication, without long-term current use of insulin (HCC) She has not been checking her blood sugars and says she has been eating a lot of easter candy. Lab Results  Component Value Date   HGBA1C 7.2 (H) 08/23/2020    4. 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 BMI 37.0-37.9, adult Weight is down 10lbs Wt Readings from Last 3 Encounters:  04/10/21 198 lb (89.8 kg)  08/23/20 208 lb (94.3 kg)  04/29/20 210 lb (95.3 kg)   BMI Readings from Last 3 Encounters:  04/10/21 32.95 kg/m  08/23/20 34.61 kg/m  04/29/20 34.95 kg/m       Outpatient Encounter Medications as of 04/10/2021  Medication Sig  . atorvastatin (LIPITOR) 40 MG tablet Take 1 tablet (40 mg total) by mouth daily. (NEEDS TO BE SEEN BEFORE NEXT REFILL)  . Blood Glucose Monitoring Suppl (CONTOUR NEXT MONITOR) w/Device KIT USE AS DIRECTED  . CONTOUR NEXT TEST test strip USE TO CHECK BLOOD SUGAR TWICE DAILY  . fenofibrate 160 MG tablet Take 1 tablet (160 mg total) by mouth daily. (NEEDS TO BE SEEN BEFORE NEXT REFILL)  . fluticasone (FLONASE)  50 MCG/ACT nasal spray Place 2 sprays into both nostrils daily.  Marland Kitchen glimepiride (AMARYL) 2 MG tablet TAKE 1 TABLET BY MOUTH EVERY DAY BEFORE BREAKFAST  (NEEDS TO BE SEEN BEFORE NEXT REFILL)  . hydrochlorothiazide (HYDRODIURIL) 25 MG tablet Take 1 tablet (25 mg total) by mouth daily. (NEEDS TO BE SEEN BEFORE NEXT REFILL)  . LANCETS MICRO THIN 33G MISC Use to check BG once daily.  Dispense whichever goes with meter and that insurance will cover.  Marland Kitchen lisinopril (ZESTRIL) 40 MG tablet Take 1 tablet (40 mg total) by mouth daily. (NEEDS TO BE SEEN BEFORE NEXT REFILL)  . metFORMIN (GLUCOPHAGE) 1000 MG tablet Take 1 tablet (1,000 mg total) by mouth 2 (two) times daily with a meal.   Facility-Administered Encounter Medications as of 04/10/2021  Medication  . cyanocobalamin ((VITAMIN B-12)) injection 1,000 mcg    Past Surgical History:  Procedure Laterality Date  . ABDOMINAL HYSTERECTOMY    . TONSILLECTOMY AND ADENOIDECTOMY      Family History  Problem Relation Age of Onset  . COPD Father     New complaints: None today  Social history: Lives by herself  Controlled substance contract: n/a    Review of Systems  Constitutional: Negative for diaphoresis.  Eyes: Negative for pain.  Respiratory: Negative for shortness of breath.   Cardiovascular: Negative for chest pain, palpitations and  leg swelling.  Gastrointestinal: Negative for abdominal pain.  Endocrine: Negative for polydipsia.  Skin: Negative for rash.  Neurological: Negative for dizziness, weakness and headaches.  Hematological: Does not bruise/bleed easily.  All other systems reviewed and are negative.      Objective:   Physical Exam Vitals and nursing note reviewed.  Constitutional:      General: She is not in acute distress.    Appearance: Normal appearance. She is well-developed.  HENT:     Head: Normocephalic.     Nose: Nose normal.  Eyes:     Pupils: Pupils are equal, round, and reactive to light.  Neck:      Vascular: No carotid bruit or JVD.  Cardiovascular:     Rate and Rhythm: Normal rate and regular rhythm.     Heart sounds: Normal heart sounds.  Pulmonary:     Effort: Pulmonary effort is normal. No respiratory distress.     Breath sounds: Normal breath sounds. No wheezing or rales.  Chest:     Chest wall: No tenderness.  Abdominal:     General: Bowel sounds are normal. There is no distension or abdominal bruit.     Palpations: Abdomen is soft. There is no hepatomegaly, splenomegaly, mass or pulsatile mass.     Tenderness: There is no abdominal tenderness.  Musculoskeletal:        General: Normal range of motion.     Cervical back: Normal range of motion and neck supple.  Lymphadenopathy:     Cervical: No cervical adenopathy.  Skin:    General: Skin is warm and dry.  Neurological:     Mental Status: She is alert and oriented to person, place, and time.     Deep Tendon Reflexes: Reflexes are normal and symmetric.  Psychiatric:        Behavior: Behavior normal.        Thought Content: Thought content normal.        Judgment: Judgment normal.     BP 136/86   Pulse 79   Temp 98.3 F (36.8 C) (Temporal)   Resp 20   Ht '5\' 5"'  (1.651 m)   Wt 198 lb (89.8 kg)   SpO2 95%   BMI 32.95 kg/m    HBA1c 6.3%  S/p bil ear irrigation with curetting- TM's clear bil     Assessment & Plan:  Emma Nunez comes in today with chief complaint of Medical Management of Chronic Issues   Diagnosis and orders addressed:  1. Essential hypertension, benign Low sodium diet - CBC with Differential/Platelet - CMP14+EGFR - lisinopril (ZESTRIL) 40 MG tablet; Take 1 tablet (40 mg total) by mouth daily. (NEEDS TO BE SEEN BEFORE NEXT REFILL)  Dispense: 90 tablet; Refill: 1 - hydrochlorothiazide (HYDRODIURIL) 25 MG tablet; Take 1 tablet (25 mg total) by mouth daily. (NEEDS TO BE SEEN BEFORE NEXT REFILL)  Dispense: 90 tablet; Refill: 1  2. Hyperlipidemia with target LDL less than 100 Low fat diet -  Lipid panel - atorvastatin (LIPITOR) 40 MG tablet; Take 1 tablet (40 mg total) by mouth daily. (NEEDS TO BE SEEN BEFORE NEXT REFILL)  Dispense: 90 tablet; Refill: 1 - fenofibrate 160 MG tablet; Take 1 tablet (160 mg total) by mouth daily. (NEEDS TO BE SEEN BEFORE NEXT REFILL)  Dispense: 90 tablet; Refill: 1  3. Type 2 diabetes mellitus without complication, without long-term current use of insulin (HCC) Continue to watch carbsin diet - Bayer DCA Hb A1c Waived - glimepiride (AMARYL) 2 MG tablet;  TAKE 1 TABLET BY MOUTH EVERY DAY BEFORE BREAKFAST  (NEEDS TO BE SEEN BEFORE NEXT REFILL)  Dispense: 90 tablet; Refill: 1 - metFORMIN (GLUCOPHAGE) 1000 MG tablet; Take 1 tablet (1,000 mg total) by mouth 2 (two) times daily with a meal.  Dispense: 180 tablet; Refill: 1  4. Vitamin B12 deficiency anemia due to intrinsic factor deficiency Labs pending - Vitamin B12  5. BMI 32.0-32.9,adult Discussed diet and exercise for person with BMI >25 Will recheck weight in 3-6 months  6. cermuen impaction Debrox 2 x a week  Labs pending Health Maintenance reviewed Diet and exercise encouraged  Follow up plan: 3 months   Kyrsten-Margaret Hassell Done, FNP

## 2021-04-11 LAB — CMP14+EGFR
ALT: 20 IU/L (ref 0–32)
AST: 20 IU/L (ref 0–40)
Albumin/Globulin Ratio: 2.1 (ref 1.2–2.2)
Albumin: 4.6 g/dL (ref 3.8–4.8)
Alkaline Phosphatase: 76 IU/L (ref 44–121)
BUN/Creatinine Ratio: 18 (ref 12–28)
BUN: 16 mg/dL (ref 8–27)
Bilirubin Total: 0.2 mg/dL (ref 0.0–1.2)
CO2: 24 mmol/L (ref 20–29)
Calcium: 9.9 mg/dL (ref 8.7–10.3)
Chloride: 101 mmol/L (ref 96–106)
Creatinine, Ser: 0.89 mg/dL (ref 0.57–1.00)
Globulin, Total: 2.2 g/dL (ref 1.5–4.5)
Glucose: 88 mg/dL (ref 65–99)
Potassium: 4.8 mmol/L (ref 3.5–5.2)
Sodium: 143 mmol/L (ref 134–144)
Total Protein: 6.8 g/dL (ref 6.0–8.5)
eGFR: 70 mL/min/{1.73_m2} (ref >59)

## 2021-04-11 LAB — CBC WITH DIFFERENTIAL/PLATELET
Basophils Absolute: 0.1 10*3/uL (ref 0.0–0.2)
Basos: 1 %
EOS (ABSOLUTE): 0.3 10*3/uL (ref 0.0–0.4)
Eos: 3 %
Hematocrit: 40.2 % (ref 34.0–46.6)
Hemoglobin: 13.1 g/dL (ref 11.1–15.9)
Immature Grans (Abs): 0 10*3/uL (ref 0.0–0.1)
Immature Granulocytes: 0 %
Lymphocytes Absolute: 2.8 10*3/uL (ref 0.7–3.1)
Lymphs: 25 %
MCH: 28.9 pg (ref 26.6–33.0)
MCHC: 32.6 g/dL (ref 31.5–35.7)
MCV: 89 fL (ref 79–97)
Monocytes Absolute: 0.6 10*3/uL (ref 0.1–0.9)
Monocytes: 6 %
Neutrophils Absolute: 7.1 10*3/uL — ABNORMAL HIGH (ref 1.4–7.0)
Neutrophils: 65 %
Platelets: 530 10*3/uL — ABNORMAL HIGH (ref 150–450)
RBC: 4.54 x10E6/uL (ref 3.77–5.28)
RDW: 12.8 % (ref 11.7–15.4)
WBC: 10.9 10*3/uL — ABNORMAL HIGH (ref 3.4–10.8)

## 2021-04-11 LAB — LIPID PANEL
Chol/HDL Ratio: 4.1 ratio (ref 0.0–4.4)
Cholesterol, Total: 199 mg/dL (ref 100–199)
HDL: 48 mg/dL (ref >39)
LDL Chol Calc (NIH): 119 mg/dL — ABNORMAL HIGH (ref 0–99)
Triglycerides: 182 mg/dL — ABNORMAL HIGH (ref 0–149)
VLDL Cholesterol Cal: 32 mg/dL (ref 5–40)

## 2021-04-11 LAB — VITAMIN B12: Vitamin B-12: 425 pg/mL (ref 232–1245)

## 2021-04-21 ENCOUNTER — Ambulatory Visit (INDEPENDENT_AMBULATORY_CARE_PROVIDER_SITE_OTHER): Payer: BC Managed Care – PPO | Admitting: Family Medicine

## 2021-04-21 ENCOUNTER — Other Ambulatory Visit: Payer: Self-pay

## 2021-04-21 DIAGNOSIS — E538 Deficiency of other specified B group vitamins: Secondary | ICD-10-CM

## 2021-04-21 DIAGNOSIS — D510 Vitamin B12 deficiency anemia due to intrinsic factor deficiency: Secondary | ICD-10-CM

## 2021-05-22 ENCOUNTER — Other Ambulatory Visit: Payer: Self-pay

## 2021-05-22 ENCOUNTER — Ambulatory Visit (INDEPENDENT_AMBULATORY_CARE_PROVIDER_SITE_OTHER): Payer: BC Managed Care – PPO

## 2021-05-22 DIAGNOSIS — D510 Vitamin B12 deficiency anemia due to intrinsic factor deficiency: Secondary | ICD-10-CM

## 2021-05-22 DIAGNOSIS — E538 Deficiency of other specified B group vitamins: Secondary | ICD-10-CM

## 2021-05-22 NOTE — Progress Notes (Signed)
Cyanocobalamin injection given to right deltoid.  Patient tolerated well. 

## 2021-06-24 ENCOUNTER — Ambulatory Visit (INDEPENDENT_AMBULATORY_CARE_PROVIDER_SITE_OTHER): Payer: BC Managed Care – PPO

## 2021-06-24 ENCOUNTER — Other Ambulatory Visit: Payer: Self-pay

## 2021-06-24 DIAGNOSIS — E538 Deficiency of other specified B group vitamins: Secondary | ICD-10-CM | POA: Diagnosis not present

## 2021-06-24 DIAGNOSIS — D510 Vitamin B12 deficiency anemia due to intrinsic factor deficiency: Secondary | ICD-10-CM

## 2021-06-24 NOTE — Progress Notes (Signed)
Cyanocobalamin injection given to left deltoid.  Patient tolerated well. 

## 2021-07-17 ENCOUNTER — Encounter: Payer: Self-pay | Admitting: Nurse Practitioner

## 2021-07-17 ENCOUNTER — Ambulatory Visit (INDEPENDENT_AMBULATORY_CARE_PROVIDER_SITE_OTHER): Payer: BC Managed Care – PPO | Admitting: Nurse Practitioner

## 2021-07-17 ENCOUNTER — Other Ambulatory Visit: Payer: Self-pay | Admitting: Nurse Practitioner

## 2021-07-17 ENCOUNTER — Other Ambulatory Visit: Payer: Self-pay

## 2021-07-17 VITALS — BP 144/82 | HR 81 | Temp 98.5°F | Resp 16 | Wt 199.0 lb

## 2021-07-17 DIAGNOSIS — Z23 Encounter for immunization: Secondary | ICD-10-CM | POA: Diagnosis not present

## 2021-07-17 DIAGNOSIS — D510 Vitamin B12 deficiency anemia due to intrinsic factor deficiency: Secondary | ICD-10-CM | POA: Diagnosis not present

## 2021-07-17 DIAGNOSIS — E119 Type 2 diabetes mellitus without complications: Secondary | ICD-10-CM

## 2021-07-17 DIAGNOSIS — I10 Essential (primary) hypertension: Secondary | ICD-10-CM | POA: Diagnosis not present

## 2021-07-17 DIAGNOSIS — Z6833 Body mass index (BMI) 33.0-33.9, adult: Secondary | ICD-10-CM

## 2021-07-17 DIAGNOSIS — E785 Hyperlipidemia, unspecified: Secondary | ICD-10-CM | POA: Diagnosis not present

## 2021-07-17 LAB — BAYER DCA HB A1C WAIVED: HB A1C (BAYER DCA - WAIVED): 6.3 % (ref <7.0)

## 2021-07-17 MED ORDER — LISINOPRIL 40 MG PO TABS
40.0000 mg | ORAL_TABLET | Freq: Every day | ORAL | 1 refills | Status: DC
Start: 2021-07-17 — End: 2022-01-20

## 2021-07-17 MED ORDER — HYDROCHLOROTHIAZIDE 25 MG PO TABS: 25.0000 mg | ORAL_TABLET | Freq: Every day | ORAL | 1 refills | Status: DC

## 2021-07-17 MED ORDER — METFORMIN HCL 1000 MG PO TABS: 1000.0000 mg | ORAL_TABLET | Freq: Two times a day (BID) | ORAL | 1 refills | Status: DC

## 2021-07-17 MED ORDER — ATORVASTATIN CALCIUM 40 MG PO TABS: 40.0000 mg | ORAL_TABLET | Freq: Every day | ORAL | 1 refills | Status: DC

## 2021-07-17 MED ORDER — GLIMEPIRIDE 2 MG PO TABS: ORAL_TABLET | ORAL | 1 refills | Status: DC

## 2021-07-17 NOTE — Progress Notes (Signed)
 Subjective:    Patient ID: Emma Nunez, female    DOB: 12/26/1950, 69 y.o.   MRN: [id number]   Chief Complaint: medical management of chronic issues     HPI:  1. Essential hypertension, benign No c/o chest pain, sob or headache. Does not check blood pressure at home. Patient is out of meds. BP Readings from Last 3 Encounters:  04/10/21 136/86  08/23/20 136/82  04/29/20 (!) 142/82     2. Hyperlipidemia with target LDL less than 100 Doe snot wtahc diet and does no dedicated exercise. Is on daily lipitor. Lab Results  Component Value Date   CHOL 199 04/10/2021   HDL 48 04/10/2021   LDLCALC 119 (H) 04/10/2021   TRIG 182 (H) 04/10/2021   CHOLHDL 4.1 04/10/2021     3. Type 2 diabetes mellitus without complication, without long-term current use of insulin (HCC) She checks blood sugars occasionally. Ususllay in the 120-130's Lab Results  Component Value Date   HGBA1C 6.3 04/10/2021     4. Vitamin B12 deficiency anemia due to intrinsic factor deficiency Is on monthly b12 injections.  5. BMI 33.0-33.9, adult No recent weight changes Wt Readings from Last 3 Encounters:  07/17/21 199 lb (90.3 kg)  04/10/21 198 lb (89.8 kg)  08/23/20 208 lb (94.3 kg)   BMI Readings from Last 3 Encounters:  07/17/21 33.12 kg/m  04/10/21 32.95 kg/m  08/23/20 34.61 kg/m      Outpatient Encounter Medications as of 07/17/2021  Medication Sig  . atorvastatin (LIPITOR) 40 MG tablet Take 1 tablet (40 mg total) by mouth daily. (NEEDS TO BE SEEN BEFORE NEXT REFILL)  . blood glucose meter kit and supplies Dispense based on patient and insurance preference. Use up to four times daily as directed. (FOR ICD-10 E10.9, E11.9).  . Blood Glucose Monitoring Suppl (CONTOUR NEXT MONITOR) w/Device KIT USE AS DIRECTED  . fenofibrate 160 MG tablet Take 1 tablet (160 mg total) by mouth daily. (NEEDS TO BE SEEN BEFORE NEXT REFILL)  . glimepiride (AMARYL) 2 MG tablet TAKE 1 TABLET BY MOUTH EVERY DAY BEFORE  BREAKFAST  (NEEDS TO BE SEEN BEFORE NEXT REFILL)  . glucose blood (ACCU-CHEK GUIDE) test strip Check BS up to 4 times daily Dx E11.9  . hydrochlorothiazide (HYDRODIURIL) 25 MG tablet Take 1 tablet (25 mg total) by mouth daily. (NEEDS TO BE SEEN BEFORE NEXT REFILL)  . LANCETS MICRO THIN 33G MISC Use to check BG once daily.  Dispense whichever goes with meter and that insurance will cover.  . lisinopril (ZESTRIL) 40 MG tablet Take 1 tablet (40 mg total) by mouth daily. (NEEDS TO BE SEEN BEFORE NEXT REFILL)  . metFORMIN (GLUCOPHAGE) 1000 MG tablet Take 1 tablet (1,000 mg total) by mouth 2 (two) times daily with a meal.   Facility-Administered Encounter Medications as of 07/17/2021  Medication  . cyanocobalamin ((VITAMIN B-12)) injection 1,000 mcg    Past Surgical History:  Procedure Laterality Date  . ABDOMINAL HYSTERECTOMY    . TONSILLECTOMY AND ADENOIDECTOMY      Family History  Problem Relation Age of Onset  . COPD Father     New complaints: None today  Social history: Lives by herself  Controlled substance contract: n/a     Review of Systems  Constitutional:  Negative for diaphoresis.  Eyes:  Negative for pain.  Respiratory:  Negative for shortness of breath.   Cardiovascular:  Negative for chest pain, palpitations and leg swelling.  Gastrointestinal:  Negative for abdominal pain.    Endocrine: Negative for polydipsia.  Skin:  Negative for rash.  Neurological:  Negative for dizziness, weakness and headaches.  Hematological:  Does not bruise/bleed easily.  All other systems reviewed and are negative.     Objective:   Physical Exam Vitals and nursing note reviewed.  Constitutional:      General: She is not in acute distress.    Appearance: Normal appearance. She is well-developed.  HENT:     Head: Normocephalic.     Right Ear: Tympanic membrane normal.     Left Ear: Tympanic membrane normal.     Nose: Nose normal.     Mouth/Throat:     Mouth: Mucous membranes  are moist.  Eyes:     Pupils: Pupils are equal, round, and reactive to light.  Neck:     Vascular: No carotid bruit or JVD.  Cardiovascular:     Rate and Rhythm: Normal rate and regular rhythm.     Heart sounds: Normal heart sounds.  Pulmonary:     Effort: Pulmonary effort is normal. No respiratory distress.     Breath sounds: Normal breath sounds. No wheezing or rales.  Chest:     Chest wall: No tenderness.  Abdominal:     General: Bowel sounds are normal. There is no distension or abdominal bruit.     Palpations: Abdomen is soft. There is no hepatomegaly, splenomegaly, mass or pulsatile mass.     Tenderness: There is no abdominal tenderness.  Musculoskeletal:        General: Normal range of motion.     Cervical back: Normal range of motion and neck supple.  Lymphadenopathy:     Cervical: No cervical adenopathy.  Skin:    General: Skin is warm and dry.  Neurological:     Mental Status: She is alert and oriented to person, place, and time.     Deep Tendon Reflexes: Reflexes are normal and symmetric.  Psychiatric:        Behavior: Behavior normal.        Thought Content: Thought content normal.        Judgment: Judgment normal.    BP (!) 144/82   Pulse 81   Temp 98.5 F (36.9 C)   Resp 16   Wt 199 lb (90.3 kg)   SpO2 98%   BMI 33.12 kg/m   HGBA1C 6.3      Assessment & Plan:   Emma Nunez comes in today with chief complaint of Follow-up   Diagnosis and orders addressed:  1. Essential hypertension, benign Low sodium diet - CMP14+EGFR - lisinopril (ZESTRIL) 40 MG tablet; Take 1 tablet (40 mg total) by mouth daily. (NEEDS TO BE SEEN BEFORE NEXT REFILL)  Dispense: 90 tablet; Refill: 1 - hydrochlorothiazide (HYDRODIURIL) 25 MG tablet; Take 1 tablet (25 mg total) by mouth daily. (NEEDS TO BE SEEN BEFORE NEXT REFILL)  Dispense: 90 tablet; Refill: 1  2. Hyperlipidemia with target LDL less than 100 Low fat diet - Lipid panel - atorvastatin (LIPITOR) 40 MG tablet;  Take 1 tablet (40 mg total) by mouth daily. (NEEDS TO BE SEEN BEFORE NEXT REFILL)  Dispense: 90 tablet; Refill: 1  3. Type 2 diabetes mellitus without complication, without long-term current use of insulin (HCC) Continue to watch carbs in diet - CBC with Differential/Platelet - Bayer DCA Hb A1c Waived - metFORMIN (GLUCOPHAGE) 1000 MG tablet; Take 1 tablet (1,000 mg total) by mouth 2 (two) times daily with a meal.  Dispense: 180 tablet; Refill: 1 -  glimepiride (AMARYL) 2 MG tablet; TAKE 1 TABLET BY MOUTH EVERY DAY BEFORE BREAKFAST  (NEEDS TO BE SEEN BEFORE NEXT REFILL)  Dispense: 90 tablet; Refill: 1  4. Vitamin B12 deficiency anemia due to intrinsic factor deficiency Continue monthly b12 injections  5. BMI 33.0-33.9,adult Discussed diet and exercise for person with BMI >25 Will recheck weight in 3-6 months    Labs pending Health Maintenance reviewed Diet and exercise encouraged  Follow up plan: 6 months   Lauran-Margaret Hassell Done, FNP

## 2021-07-18 LAB — CBC WITH DIFFERENTIAL/PLATELET
Basophils Absolute: 0.1 x10E3/uL (ref 0.0–0.2)
Basos: 1 %
EOS (ABSOLUTE): 0.3 x10E3/uL (ref 0.0–0.4)
Eos: 3 %
Hematocrit: 38.9 % (ref 34.0–46.6)
Hemoglobin: 12.9 g/dL (ref 11.1–15.9)
Immature Grans (Abs): 0 x10E3/uL (ref 0.0–0.1)
Immature Granulocytes: 0 %
Lymphocytes Absolute: 3.1 x10E3/uL (ref 0.7–3.1)
Lymphs: 27 %
MCH: 29 pg (ref 26.6–33.0)
MCHC: 33.2 g/dL (ref 31.5–35.7)
MCV: 87 fL (ref 79–97)
Monocytes Absolute: 0.6 x10E3/uL (ref 0.1–0.9)
Monocytes: 5 %
Neutrophils Absolute: 7.4 x10E3/uL — ABNORMAL HIGH (ref 1.4–7.0)
Neutrophils: 64 %
Platelets: 522 x10E3/uL — ABNORMAL HIGH (ref 150–450)
RBC: 4.45 x10E6/uL (ref 3.77–5.28)
RDW: 12.8 % (ref 11.7–15.4)
WBC: 11.6 x10E3/uL — ABNORMAL HIGH (ref 3.4–10.8)

## 2021-07-18 LAB — CMP14+EGFR
ALT: 26 IU/L (ref 0–32)
AST: 29 IU/L (ref 0–40)
Albumin/Globulin Ratio: 2 (ref 1.2–2.2)
Albumin: 4.5 g/dL (ref 3.8–4.8)
Alkaline Phosphatase: 91 IU/L (ref 44–121)
BUN/Creatinine Ratio: 22 (ref 12–28)
BUN: 18 mg/dL (ref 8–27)
Bilirubin Total: 0.2 mg/dL (ref 0.0–1.2)
CO2: 22 mmol/L (ref 20–29)
Calcium: 9.6 mg/dL (ref 8.7–10.3)
Chloride: 100 mmol/L (ref 96–106)
Creatinine, Ser: 0.82 mg/dL (ref 0.57–1.00)
Globulin, Total: 2.3 g/dL (ref 1.5–4.5)
Glucose: 141 mg/dL — ABNORMAL HIGH (ref 65–99)
Potassium: 4.3 mmol/L (ref 3.5–5.2)
Sodium: 139 mmol/L (ref 134–144)
Total Protein: 6.8 g/dL (ref 6.0–8.5)
eGFR: 77 mL/min/1.73

## 2021-07-18 LAB — LIPID PANEL
Chol/HDL Ratio: 4.3 ratio (ref 0.0–4.4)
Cholesterol, Total: 196 mg/dL (ref 100–199)
HDL: 46 mg/dL (ref >39)
LDL Chol Calc (NIH): 109 mg/dL — ABNORMAL HIGH (ref 0–99)
Triglycerides: 240 mg/dL — ABNORMAL HIGH (ref 0–149)
VLDL Cholesterol Cal: 41 mg/dL — ABNORMAL HIGH (ref 5–40)

## 2021-07-18 NOTE — Addendum Note (Signed)
Addended by: Cleda Daub on: 07/18/2021 09:51 AM   Modules accepted: Orders

## 2021-07-25 ENCOUNTER — Ambulatory Visit (INDEPENDENT_AMBULATORY_CARE_PROVIDER_SITE_OTHER): Payer: BC Managed Care – PPO

## 2021-07-25 ENCOUNTER — Other Ambulatory Visit: Payer: Self-pay

## 2021-07-25 DIAGNOSIS — E538 Deficiency of other specified B group vitamins: Secondary | ICD-10-CM

## 2021-07-25 DIAGNOSIS — D510 Vitamin B12 deficiency anemia due to intrinsic factor deficiency: Secondary | ICD-10-CM

## 2021-07-25 NOTE — Progress Notes (Signed)
Cyanocobalamin injection given to right deltoid.  Patient tolerated well. 

## 2021-08-27 ENCOUNTER — Other Ambulatory Visit: Payer: Self-pay

## 2021-08-27 ENCOUNTER — Ambulatory Visit (INDEPENDENT_AMBULATORY_CARE_PROVIDER_SITE_OTHER): Payer: BC Managed Care – PPO | Admitting: Family Medicine

## 2021-08-27 DIAGNOSIS — E538 Deficiency of other specified B group vitamins: Secondary | ICD-10-CM

## 2021-08-27 DIAGNOSIS — D510 Vitamin B12 deficiency anemia due to intrinsic factor deficiency: Secondary | ICD-10-CM

## 2021-09-29 ENCOUNTER — Other Ambulatory Visit: Payer: Self-pay

## 2021-09-29 ENCOUNTER — Ambulatory Visit (INDEPENDENT_AMBULATORY_CARE_PROVIDER_SITE_OTHER): Payer: BC Managed Care – PPO

## 2021-09-29 DIAGNOSIS — E538 Deficiency of other specified B group vitamins: Secondary | ICD-10-CM | POA: Diagnosis not present

## 2021-09-29 DIAGNOSIS — D510 Vitamin B12 deficiency anemia due to intrinsic factor deficiency: Secondary | ICD-10-CM

## 2021-09-29 NOTE — Progress Notes (Signed)
Cyanocobalamin injection given to right deltoid.  Patient tolerated well. 

## 2021-10-14 LAB — HM DIABETES EYE EXAM

## 2021-10-15 ENCOUNTER — Other Ambulatory Visit: Payer: Self-pay | Admitting: Nurse Practitioner

## 2021-10-15 DIAGNOSIS — E785 Hyperlipidemia, unspecified: Secondary | ICD-10-CM

## 2021-10-15 DIAGNOSIS — Z23 Encounter for immunization: Secondary | ICD-10-CM | POA: Diagnosis not present

## 2021-10-23 DIAGNOSIS — U071 COVID-19: Secondary | ICD-10-CM | POA: Diagnosis not present

## 2021-10-30 ENCOUNTER — Other Ambulatory Visit: Payer: Self-pay

## 2021-10-30 ENCOUNTER — Ambulatory Visit (INDEPENDENT_AMBULATORY_CARE_PROVIDER_SITE_OTHER): Payer: BC Managed Care – PPO

## 2021-10-30 DIAGNOSIS — E538 Deficiency of other specified B group vitamins: Secondary | ICD-10-CM | POA: Diagnosis not present

## 2021-10-30 DIAGNOSIS — D510 Vitamin B12 deficiency anemia due to intrinsic factor deficiency: Secondary | ICD-10-CM

## 2021-10-30 NOTE — Progress Notes (Signed)
Cyanocobalamin injection given to left deltoid.  Patient tolerated well. 

## 2021-11-17 ENCOUNTER — Ambulatory Visit (INDEPENDENT_AMBULATORY_CARE_PROVIDER_SITE_OTHER): Payer: BC Managed Care – PPO

## 2021-11-17 VITALS — Ht 65.0 in | Wt 199.0 lb

## 2021-11-17 DIAGNOSIS — Z0000 Encounter for general adult medical examination without abnormal findings: Secondary | ICD-10-CM | POA: Diagnosis not present

## 2021-11-17 NOTE — Progress Notes (Signed)
Subjective:   Emma Nunez is a 70 y.o. female who presents for an Initial Medicare Annual Wellness Visit. Virtual Visit via Telephone Note  I connected with  Emma Nunez on 11/17/21 at 11:15 AM EST by telephone and verified that I am speaking with the correct person using two identifiers.  Location: Patient: Home Provider: [facility] Persons participating in the virtual visit: patient/Nurse Health Advisor   I discussed the limitations, risks, security and privacy concerns of performing an evaluation and management service by telephone and the availability of in person appointments. The patient expressed understanding and agreed to proceed.  Interactive audio and video telecommunications were attempted between this nurse and patient, however failed, due to patient having technical difficulties OR patient did not have access to video capability.  We continued and completed visit with audio only.  Some vital signs may be absent or patient reported.   Chriss Driver, LPN  Review of Systems     Cardiac Risk Factors include: advanced age (>13mn, >>76women);diabetes mellitus;hypertension;dyslipidemia;obesity (BMI >30kg/m2);sedentary lifestyle     Objective:    Today's Vitals   11/17/21 1117  Weight: 199 lb (90.3 kg)  Height: _0  (1.651 m)   Body mass index is 33.12 kg/m.  Advanced Directives 11/17/2021  Does Patient Have a Medical Advance Directive? No  Would patient like information on creating a medical advance directive? No - Patient declined    Current Medications (verified) Outpatient Encounter Medications as of 11/17/2021  Medication Sig   atorvastatin (LIPITOR) 40 MG tablet Take 1 tablet (40 mg total) by mouth daily. (NEEDS TO BE SEEN BEFORE NEXT REFILL)   blood glucose meter kit and supplies Dispense based on patient and insurance preference. Use up to four times daily as directed. (FOR ICD-10 E10.9, E11.9).   Blood Glucose Monitoring Suppl (CONTOUR NEXT MONITOR)  w/Device KIT USE AS DIRECTED   fenofibrate 160 MG tablet TAKE 1 TABLET (160 MG TOTAL) BY MOUTH DAILY. (NEEDS TO BE SEEN BEFORE NEXT REFILL)   glimepiride (AMARYL) 2 MG tablet TAKE 1 TABLET BY MOUTH EVERY DAY BEFORE BREAKFAST  (NEEDS TO BE SEEN BEFORE NEXT REFILL)   glucose blood (ACCU-CHEK GUIDE) test strip Check BS up to 4 times daily Dx E11.9   hydrochlorothiazide (HYDRODIURIL) 25 MG tablet Take 1 tablet (25 mg total) by mouth daily. (NEEDS TO BE SEEN BEFORE NEXT REFILL)   LANCETS MICRO THIN 33G MISC Use to check BG once daily.  Dispense whichever goes with meter and that insurance will cover.   lisinopril (ZESTRIL) 40 MG tablet Take 1 tablet (40 mg total) by mouth daily. (NEEDS TO BE SEEN BEFORE NEXT REFILL)   metFORMIN (GLUCOPHAGE) 1000 MG tablet Take 1 tablet (1,000 mg total) by mouth 2 (two) times daily with a meal.   Facility-Administered Encounter Medications as of 11/17/2021  Medication   cyanocobalamin ((VITAMIN B-12)) injection 1,000 mcg    Allergies (verified) Patient has no known allergies.   History: Past Medical History:  Diagnosis Date   Diabetes mellitus without complication ([place]    Past Surgical History:  Procedure Laterality Date   ABDOMINAL HYSTERECTOMY     TONSILLECTOMY AND ADENOIDECTOMY     Family History  Problem Relation Age of Onset   COPD Father    Social History   Socioeconomic History   Marital status: Widowed    Spouse name: Not on file   Number of children: 2   Years of education: Not on file   Highest education  level: Not on file  Occupational History   Not on file  Tobacco Use   Smoking status: Never   Smokeless tobacco: Never  Substance and Sexual Activity   Alcohol use: No    Alcohol/week: 0.0 standard drinks   Drug use: No   Sexual activity: Not on file  Other Topics Concern   Not on file  Social History Narrative   2 daughters   5 grandchildren   Social Determinants of Health   Financial Resource Strain: Low Risk     Difficulty of Paying Living Expenses: Not hard at all  Food Insecurity: Unknown   Worried About Charity fundraiser in the Last Year: Not on file   [employer] of Food in the Last Year: Never true  Transportation Needs: No Transportation Needs   Lack of Transportation (Medical): No   Lack of Transportation (Non-Medical): No  Physical Activity: Insufficiently Active   Days of Exercise per Week: 3 days   Minutes of Exercise per Session: 20 min  Stress: No Stress Concern Present   Feeling of Stress : Not at all  Social Connections: Socially Isolated   Frequency of Communication with Friends and Family: More than three times a week   Frequency of Social Gatherings with Friends and Family: More than three times a week   Attends Religious Services: Never   Marine scientist or Organizations: No   Attends Archivist Meetings: Never   Marital Status: Widowed    Tobacco Counseling Counseling given: Not Answered   Clinical Intake:  Pre-visit preparation completed: Yes  Pain : No/denies pain     BMI - recorded: 33.12 Nutritional Status: BMI > 30  Obese Nutritional Risks: None Diabetes: Yes  How often do you need to have someone help you when you read instructions, pamphlets, or other written materials from your doctor or pharmacy?: 1 - Never  Diabetic?Nutrition Risk Assessment:  Has the patient had any N/V/D within the last 2 months?  No  Does the patient have any non-healing wounds?  No  Has the patient had any unintentional weight loss or weight gain?  No   Diabetes:  Is the patient diabetic?  Yes  If diabetic, was a CBG obtained today?  No  Did the patient bring in their glucometer from home?  No  How often do you monitor your CBG's? weekly.   Financial Strains and Diabetes Management:  Are you having any financial strains with the device, your supplies or your medication? No .  Does the patient want to be seen by Chronic Care Management for management of  their diabetes?  No  Would the patient like to be referred to a Nutritionist or for Diabetic Management?  No   Diabetic Exams:  Diabetic Eye Exam: Completed 2022. Overdue for diabetic eye exam. Pt has been advised about the importance in completing this exam. A referral has been placed today. Message sent to referral coordinator for scheduling purposes. Advised pt to expect a call from office referred to regarding appt.  Diabetic Foot Exam: Completed 04/10/21. Pt has been advised about the importance in completing this exam.   Interpreter Needed?: No  Information entered by :: MJ Nils Thor, LPN   Activities of Daily Living In your present state of health, do you have any difficulty performing the following activities: 11/17/2021 04/10/2021  Hearing? N N  Vision? N N  Difficulty concentrating or making decisions? N N  Walking or climbing stairs? N N  Dressing or  bathing? N N  Doing errands, shopping? N N  Preparing Food and eating ? N -  Using the Toilet? N -  In the past six months, have you accidently leaked urine? N -  Do you have problems with loss of bowel control? N -  Managing your Medications? N -  Managing your Finances? N -  Housekeeping or managing your Housekeeping? N -  Some recent data might be hidden    Patient Care Team: Chevis Pretty, FNP as PCP - General (Nurse Practitioner)  Indicate any recent Medical Services you may have received from other than [facility] providers in the past year (date may be approximate).     Assessment:   This is a routine wellness examination for [facility].  Hearing/Vision screen Hearing Screening - Comments:: No hearing issues.  Vision Screening - Comments:: Glasses. My [place], [place] 2022.  Dietary issues and exercise activities discussed: Current Exercise Habits: Home exercise routine, Type of exercise: walking, Time (Minutes): 20, Frequency (Times/Week): 3, Weekly Exercise (Minutes/Week): 60, Intensity: Mild, Exercise limited by:  cardiac condition(s)   Goals Addressed             This Visit's Progress    DIET - REDUCE CALORIE INTAKE       Pt would like to lose weight, exercise more and lose weight.        Depression Screen PHQ 2/9 Scores 11/17/2021 07/17/2021 04/10/2021 08/23/2020 04/29/2020 01/29/2020 10/13/2019  PHQ - 2 Score 0 0 0 0 0 0 0  PHQ- 9 Score - 0 - - - - -    Fall Risk Fall Risk  11/17/2021 07/17/2021 04/10/2021 08/23/2020 04/29/2020  Falls in the past year? 0 0 0 0 0  Number falls in past yr: 0 - - - -  Injury with Fall? 0 - - - -  Risk for fall due to : No Fall Risks - - - -  Follow up Falls prevention discussed - - - -    FALL RISK PREVENTION PERTAINING TO THE HOME:  Any stairs in or around the home? Yes  If so, are there any without handrails? No  Home free of loose throw rugs in walkways, pet beds, electrical cords, etc? Yes  Adequate lighting in your home to reduce risk of falls? Yes   ASSISTIVE DEVICES UTILIZED TO PREVENT FALLS:  Life alert? No  Use of a cane, walker or w/c? No  Grab bars in the bathroom? No  Shower chair or bench in shower? No  Elevated toilet seat or a handicapped toilet? No   TIMED UP AND GO:  Was the test performed? No . Phone visit.   Cognitive Function:     6CIT Screen 11/17/2021  What Year? 0 points  What month? 0 points  What time? 0 points  Count back from 20 0 points  Months in reverse 0 points  Repeat phrase 0 points  Total Score 0    Immunizations Immunization History  Administered Date(s) Administered   Fluad Quad(high Dose 65+) 09/07/2019, 09/05/2020   Influenza Split 10/11/2013   Influenza, High Dose Seasonal PF 10/17/2018   Influenza,inj,Quad PF,6+ Mos 10/04/2014   Moderna SARS-COV2 Booster Vaccination 11/20/2020   Moderna Sars-Covid-2 Vaccination 02/15/2020, 03/15/2020   Pneumococcal Conjugate-13 06/09/2018   Pneumococcal Polysaccharide-23 09/07/2019   Tdap 01/28/2016   Zoster Recombinat (Shingrix) 07/17/2021   Zoster, Live  02/07/2015    TDAP status: Up to date  Flu Vaccine status: Due, Education has been provided regarding the importance of this  vaccine. Advised may receive this vaccine at local pharmacy or Health Dept. Aware to provide a copy of the vaccination record if obtained from local pharmacy or Health Dept. Verbalized acceptance and understanding.  Pneumococcal vaccine status: Up to date  Covid-19 vaccine status: Information provided on how to obtain vaccines.   Qualifies for Shingles Vaccine? Yes   Zostavax completed Yes   Shingrix Completed?: No.    Education has been provided regarding the importance of this vaccine. Patient has been advised to call insurance company to determine out of pocket expense if they have not yet received this vaccine. Advised may also receive vaccine at local pharmacy or Health Dept. Verbalized acceptance and understanding.  Screening Tests Health Maintenance  Topic Date Due   Fecal DNA (Cologuard)  Never done   COVID-19 Vaccine (3 - Booster for Moderna series) 01/15/2021   INFLUENZA VACCINE  07/21/2021   OPHTHALMOLOGY EXAM  08/07/2021   Zoster Vaccines- Shingrix (2 of 2) 09/11/2021   MAMMOGRAM  04/10/2022 (Originally 11/19/2016)   HEMOGLOBIN A1C  01/17/2022   DEXA SCAN  01/28/2022   FOOT EXAM  04/10/2022   TETANUS/TDAP  01/27/2026   Pneumonia Vaccine 61+ Years old  Completed   Hepatitis C Screening  Completed   HPV VACCINES  Aged Out    Health Maintenance  Health Maintenance Due  Topic Date Due   Fecal DNA (Cologuard)  Never done   COVID-19 Vaccine (3 - Booster for Moderna series) 01/15/2021   INFLUENZA VACCINE  07/21/2021   OPHTHALMOLOGY EXAM  08/07/2021   Zoster Vaccines- Shingrix (2 of 2) 09/11/2021    Colorectal cancer screening: Type of screening: Cologuard. Completed Pt has kit but has not completed yet. Repeat every 3 years  Mammogram status: Completed 07/17/2021. Repeat every year  Bone Density status: Completed 01/29/2020. Results reflect:  Bone density results: NORMAL. Repeat every 2 years.  Lung Cancer Screening: (Low Dose CT Chest recommended if Age 66-80 years, 30 pack-year currently smoking OR have quit w/in 15years.) does not qualify.  NON SMOKER.  Additional Screening:  Hepatitis C Screening: does qualify; Completed 01/28/2016  Vision Screening: Recommended annual ophthalmology exams for early detection of glaucoma and other disorders of the eye. Is the patient up to date with their annual eye exam?  Yes  Who is the provider or what is the name of the office in which the patient attends annual eye exams? [facility] If pt is not established with a provider, would they like to be referred to a provider to establish care? No .   Dental Screening: Recommended annual dental exams for proper oral hygiene  Community Resource Referral / Chronic Care Management: CRR required this visit?  No   CCM required this visit?  No      Plan:     I have personally reviewed and noted the following in the patient's chart:   Medical and social history Use of alcohol, tobacco or illicit drugs  Current medications and supplements including opioid prescriptions. Patient is not currently taking opioid prescriptions. Functional ability and status Nutritional status Physical activity Advanced directives List of other physicians Hospitalizations, surgeries, and ER visits in previous 12 months Vitals Screenings to include cognitive, depression, and falls Referrals and appointments  In addition, I have reviewed and discussed with patient certain preventive protocols, quality metrics, and best practice recommendations. A written personalized care plan for preventive services as well as general preventive health recommendations were provided to patient.     Karle Starch  Cedric Fishman, LPN   [id number]   Nurse Notes: Phone visit. Pt has Cologuard kit but has not completed it at this time. Discussed flu vaccine and how to obtain.

## 2021-11-17 NOTE — Patient Instructions (Signed)
Emma Nunez , Thank you for taking time to come for your Medicare Wellness Visit. I appreciate your ongoing commitment to your health goals. Please review the following plan we discussed and let me know if I can assist you in the future.   Screening recommendations/referrals: Colonoscopy: Cologuard kit. Please complete at your convenience .  Mammogram: Done 07/17/2021 Repeat annually  Bone Density: Done 01/29/2020 Repeat every 2 years  Recommended yearly ophthalmology/optometry visit for glaucoma screening and checkup Recommended yearly dental visit for hygiene and checkup  Vaccinations: Influenza vaccine: Due Repeat annually  Pneumococcal vaccine: Done 06/09/2018 and 09/07/2019 Tdap vaccine: Done 01/28/2016 Repeat in 10 years  Shingles vaccine: Done Zoster, 02/07/2015. Shingrix, 07/17/2021.   Covid-19:Done 02/15/2020, 03/15/2020 and 11/20/2020  Advanced directives: Advance directive discussed with you today. Even though you declined this today, please call our office should you change your mind, and we can give you the proper paperwork for you to fill out.   Conditions/risks identified: Aim for 30 minutes of exercise or brisk walking each day, drink 6-8 glasses of water and eat lots of fruits and vegetables.   Next appointment: Follow up in one year for your annual wellness visit 2023.   Preventive Care 23 Years and Older, Female Preventive care refers to lifestyle choices and visits with your health care provider that can promote health and wellness. What does preventive care include? A yearly physical exam. This is also called an annual well check. Dental exams once or twice a year. Routine eye exams. Ask your health care provider how often you should have your eyes checked. Personal lifestyle choices, including: Daily care of your teeth and gums. Regular physical activity. Eating a healthy diet. Avoiding tobacco and drug use. Limiting alcohol use. Practicing safe sex. Taking  low-dose aspirin every day. Taking vitamin and mineral supplements as recommended by your health care provider. What happens during an annual well check? The services and screenings done by your health care provider during your annual well check will depend on your age, overall health, lifestyle risk factors, and family history of disease. Counseling  Your health care provider may ask you questions about your: Alcohol use. Tobacco use. Drug use. Emotional well-being. Home and relationship well-being. Sexual activity. Eating habits. History of falls. Memory and ability to understand (cognition). Work and work Statistician. Reproductive health. Screening  You may have the following tests or measurements: Height, weight, and BMI. Blood pressure. Lipid and cholesterol levels. These may be checked every 5 years, or more frequently if you are over 66 years old. Skin check. Lung cancer screening. You may have this screening every year starting at age 40 if you have a 30-pack-year history of smoking and currently smoke or have quit within the past 15 years. Fecal occult blood test (FOBT) of the stool. You may have this test every year starting at age 49. Flexible sigmoidoscopy or colonoscopy. You may have a sigmoidoscopy every 5 years or a colonoscopy every 10 years starting at age 57. Hepatitis C blood test. Hepatitis B blood test. Sexually transmitted disease (STD) testing. Diabetes screening. This is done by checking your blood sugar (glucose) after you have not eaten for a while (fasting). You may have this done every 1-3 years. Bone density scan. This is done to screen for osteoporosis. You may have this done starting at age 84. Mammogram. This may be done every 1-2 years. Talk to your health care provider about how often you should have regular mammograms. Talk with your health care  provider about your test results, treatment options, and if necessary, the need for more tests. Vaccines   Your health care provider may recommend certain vaccines, such as: Influenza vaccine. This is recommended every year. Tetanus, diphtheria, and acellular pertussis (Tdap, Td) vaccine. You may need a Td booster every 10 years. Zoster vaccine. You may need this after age 53. Pneumococcal 13-valent conjugate (PCV13) vaccine. One dose is recommended after age 79. Pneumococcal polysaccharide (PPSV23) vaccine. One dose is recommended after age 90. Talk to your health care provider about which screenings and vaccines you need and how often you need them. This information is not intended to replace advice given to you by your health care provider. Make sure you discuss any questions you have with your health care provider. Document Released: 01/03/2016 Document Revised: 08/26/2016 Document Reviewed: 10/08/2015 Elsevier Interactive Patient Education  2017 [place] Prevention in the Home Falls can cause injuries. They can happen to people of all ages. There are many things you can do to make your home safe and to help prevent falls. What can I do on the outside of my home? Regularly fix the edges of walkways and driveways and fix any cracks. Remove anything that might make you trip as you walk through a door, such as a raised step or threshold. Trim any bushes or trees on the path to your home. Use bright outdoor lighting. Clear any walking paths of anything that might make someone trip, such as rocks or tools. Regularly check to see if handrails are loose or broken. Make sure that both sides of any steps have handrails. Any raised decks and porches should have guardrails on the edges. Have any leaves, snow, or ice cleared regularly. Use sand or salt on walking paths during winter. Clean up any spills in your garage right away. This includes oil or grease spills. What can I do in the bathroom? Use night lights. Install grab bars by the toilet and in the tub and shower. Do not use towel  bars as grab bars. Use non-skid mats or decals in the tub or shower. If you need to sit down in the shower, use a plastic, non-slip stool. Keep the floor dry. Clean up any water that spills on the floor as soon as it happens. Remove soap buildup in the tub or shower regularly. Attach bath mats securely with double-sided non-slip rug tape. Do not have throw rugs and other things on the floor that can make you trip. What can I do in the bedroom? Use night lights. Make sure that you have a light by your bed that is easy to reach. Do not use any sheets or blankets that are too big for your bed. They should not hang down onto the floor. Have a firm chair that has side arms. You can use this for support while you get dressed. Do not have throw rugs and other things on the floor that can make you trip. What can I do in the kitchen? Clean up any spills right away. Avoid walking on wet floors. Keep items that you use a lot in easy-to-reach places. If you need to reach something above you, use a strong step stool that has a grab bar. Keep electrical cords out of the way. Do not use floor polish or wax that makes floors slippery. If you must use wax, use non-skid floor wax. Do not have throw rugs and other things on the floor that can make you trip. What can I  do with my stairs? Do not leave any items on the stairs. Make sure that there are handrails on both sides of the stairs and use them. Fix handrails that are broken or loose. Make sure that handrails are as long as the stairways. Check any carpeting to make sure that it is firmly attached to the stairs. Fix any carpet that is loose or worn. Avoid having throw rugs at the top or bottom of the stairs. If you do have throw rugs, attach them to the floor with carpet tape. Make sure that you have a light switch at the top of the stairs and the bottom of the stairs. If you do not have them, ask someone to add them for you. What else can I do to help  prevent falls? Wear shoes that: Do not have high heels. Have rubber bottoms. Are comfortable and fit you well. Are closed at the toe. Do not wear sandals. If you use a stepladder: Make sure that it is fully opened. Do not climb a closed stepladder. Make sure that both sides of the stepladder are locked into place. Ask someone to hold it for you, if possible. Clearly mark and make sure that you can see: Any grab bars or handrails. First and last steps. Where the edge of each step is. Use tools that help you move around (mobility aids) if they are needed. These include: Canes. Walkers. Scooters. Crutches. Turn on the lights when you go into a dark area. Replace any light bulbs as soon as they burn out. Set up your furniture so you have a clear path. Avoid moving your furniture around. If any of your floors are uneven, fix them. If there are any pets around you, be aware of where they are. Review your medicines with your doctor. Some medicines can make you feel dizzy. This can increase your chance of falling. Ask your doctor what other things that you can do to help prevent falls. This information is not intended to replace advice given to you by your health care provider. Make sure you discuss any questions you have with your health care provider. Document Released: 10/03/2009 Document Revised: 05/14/2016 Document Reviewed: 01/11/2015 Elsevier Interactive Patient Education  2017 [employer].

## 2021-11-25 ENCOUNTER — Other Ambulatory Visit: Payer: Self-pay | Admitting: Nurse Practitioner

## 2021-11-25 DIAGNOSIS — I10 Essential (primary) hypertension: Secondary | ICD-10-CM

## 2021-12-01 ENCOUNTER — Ambulatory Visit (INDEPENDENT_AMBULATORY_CARE_PROVIDER_SITE_OTHER): Payer: BC Managed Care – PPO | Admitting: *Deleted

## 2021-12-01 DIAGNOSIS — E538 Deficiency of other specified B group vitamins: Secondary | ICD-10-CM

## 2021-12-01 DIAGNOSIS — D510 Vitamin B12 deficiency anemia due to intrinsic factor deficiency: Secondary | ICD-10-CM

## 2021-12-01 NOTE — Progress Notes (Signed)
B12 injection given and patient tolerated well.  

## 2022-01-01 ENCOUNTER — Ambulatory Visit (INDEPENDENT_AMBULATORY_CARE_PROVIDER_SITE_OTHER): Payer: BC Managed Care – PPO

## 2022-01-01 DIAGNOSIS — E538 Deficiency of other specified B group vitamins: Secondary | ICD-10-CM

## 2022-01-01 DIAGNOSIS — D510 Vitamin B12 deficiency anemia due to intrinsic factor deficiency: Secondary | ICD-10-CM

## 2022-01-01 NOTE — Progress Notes (Signed)
Cyanocobalamin injection given to left deltoid.  Patient tolerated well. 

## 2022-01-20 ENCOUNTER — Encounter: Payer: Self-pay | Admitting: Nurse Practitioner

## 2022-01-20 ENCOUNTER — Ambulatory Visit (INDEPENDENT_AMBULATORY_CARE_PROVIDER_SITE_OTHER): Payer: BC Managed Care – PPO | Admitting: Nurse Practitioner

## 2022-01-20 VITALS — BP 129/64 | HR 77 | Temp 97.7°F | Resp 20 | Ht 65.0 in | Wt 195.0 lb

## 2022-01-20 DIAGNOSIS — Z23 Encounter for immunization: Secondary | ICD-10-CM

## 2022-01-20 DIAGNOSIS — I10 Essential (primary) hypertension: Secondary | ICD-10-CM | POA: Diagnosis not present

## 2022-01-20 DIAGNOSIS — D510 Vitamin B12 deficiency anemia due to intrinsic factor deficiency: Secondary | ICD-10-CM | POA: Diagnosis not present

## 2022-01-20 DIAGNOSIS — E785 Hyperlipidemia, unspecified: Secondary | ICD-10-CM | POA: Diagnosis not present

## 2022-01-20 DIAGNOSIS — E119 Type 2 diabetes mellitus without complications: Secondary | ICD-10-CM

## 2022-01-20 DIAGNOSIS — Z6837 Body mass index (BMI) 37.0-37.9, adult: Secondary | ICD-10-CM

## 2022-01-20 LAB — BAYER DCA HB A1C WAIVED: HB A1C (BAYER DCA - WAIVED): 6.2 % — ABNORMAL HIGH (ref 4.8–5.6)

## 2022-01-20 MED ORDER — FENOFIBRATE 160 MG PO TABS: 160.0000 mg | ORAL_TABLET | Freq: Every day | ORAL | 1 refills | Status: DC

## 2022-01-20 MED ORDER — METFORMIN HCL 1000 MG PO TABS: 1000.0000 mg | ORAL_TABLET | Freq: Two times a day (BID) | ORAL | 1 refills | Status: DC

## 2022-01-20 MED ORDER — GLIMEPIRIDE 2 MG PO TABS: ORAL_TABLET | ORAL | 1 refills | Status: DC

## 2022-01-20 MED ORDER — HYDROCHLOROTHIAZIDE 25 MG PO TABS: 25.0000 mg | ORAL_TABLET | Freq: Every day | ORAL | 1 refills | Status: DC

## 2022-01-20 MED ORDER — SCOPOLAMINE 1 MG/3DAYS TD PT72: 1.0000 | MEDICATED_PATCH | TRANSDERMAL | 1 refills | Status: DC

## 2022-01-20 MED ORDER — ATORVASTATIN CALCIUM 40 MG PO TABS: 40.0000 mg | ORAL_TABLET | Freq: Every day | ORAL | 1 refills | Status: DC

## 2022-01-20 MED ORDER — LISINOPRIL 40 MG PO TABS: 40.0000 mg | ORAL_TABLET | Freq: Every day | ORAL | 1 refills | Status: DC

## 2022-01-20 NOTE — Patient Instructions (Signed)
Cologuard ° °Your provider has prescribed Cologuard, an easy-to-use, noninvasive test for colon cancer screening, based on the latest advances in stool DNA science.  ° °Here's what will happen next: ° °1. You may receive a call or email from [facility] to confirm your mailing address and insurance information °2. Your kit will be shipped directly to you °3. You collect your stool sample in the privacy of your own home. Please follow the instructions that come with the kit °4. You return the kit via UPS prepaid shipping or pick-up, in the same box it arrived in °5. You should receive a call with the results once they are available. If you do not receive a call, please contact our office at [phone] ° °Insurance Coverage ° °Cologuard is covered by Medicare and most major insurers °Cologuard is covered by Medicare and Medicare Advantage with no co-pay or deductible for eligible patients ages 50-85. °[employer]ationwide, more than 94% of Cologuard patients have no out-of-pocket cost for screening. °• Based on the Affordable Care Act, Cologuard should be covered by most private insurers with no co-pay or deductible for eligible patients (ages 50-75; at average risk for colon cancer; without symptoms). °Currently, ~74% of Cologuard patients 45-49 have had no out-of-pocket cost for screening.  °• Many national and regional payers have begun paying for CRC screening at 45. [facility] continues to work with payers to expand coverage and access for patients ages 45-49.  ° °Only your healthcare insurance provider can confirm how Cologuard will be covered for you. If you have questions about coverage, you can contact your insurance company directly or ask the specialists at [facility] to do that for you. A Customer Support Specialist can be reached at [phone].   ° °Patient Support °Screening for colon cancer is very important to your good health, so if you have any questions at all, please call [facility] Customer Support Specialists at [phone]. They are available 24 hours a day, 6 days a week. An instructional video is available to view online at [email or web address]  ° °*Information and graphics obtained from [email or web address] ° °

## 2022-01-20 NOTE — Progress Notes (Signed)
Subjective:    Patient ID: CHESTINE Nunez, female    DOB: 1951-11-14, 71 y.o.   MRN: [id number]   Chief Complaint: medical management of chronic issues     HPI:  Emma Nunez is a 71 y.o. who identifies as a female who was assigned female at birth.   Social history: Lives with: husband Work history: retired from [employer]. Works part time for [employer] in today for follow up of the following chronic medical issues:  1. Essential hypertension, benign No c/o chest pain, sob or headache. Doe snot check blood pressure at home. BP Readings from Last 3 Encounters:  07/17/21 (!) 144/82  04/10/21 136/86  08/23/20 136/82     2. Hyperlipidemia with target LDL less than 100 Does not watch diet and does no dedicated exercise. Lab Results  Component Value Date   CHOL 196 07/17/2021   HDL 46 07/17/2021   LDLCALC 109 (H) 07/17/2021   TRIG 240 (H) 07/17/2021   CHOLHDL 4.3 07/17/2021     3. Type 2 diabetes mellitus without complication, without long-term current use of insulin (HCC) Fasting blood sugars are running up and down. From 87-140. No low blood sugars  4. Vitamin B12 deficiency anemia due to intrinsic factor deficiency Is on monthly b12 injection  5. BMI 37.0-37.9, adult  Is on a counting calories diet and weight is down 4lbs. Wt Readings from Last 3 Encounters:  01/20/22 195 lb (88.5 kg)  11/17/21 199 lb (90.3 kg)  07/17/21 199 lb (90.3 kg)   BMI Readings from Last 3 Encounters:  01/20/22 32.45 kg/m  11/17/21 33.12 kg/m  07/17/21 33.12 kg/m      New complaints: Is going to [place] in April- needs motion sickness patch  No Known Allergies Outpatient Encounter Medications as of 01/20/2022  Medication Sig   atorvastatin (LIPITOR) 40 MG tablet Take 1 tablet (40 mg total) by mouth daily. (NEEDS TO BE SEEN BEFORE NEXT REFILL)   blood glucose meter kit and supplies Dispense based on patient and insurance preference. Use up to four times daily as  directed. (FOR ICD-10 E10.9, E11.9).   Blood Glucose Monitoring Suppl (CONTOUR NEXT MONITOR) w/Device KIT USE AS DIRECTED   fenofibrate 160 MG tablet TAKE 1 TABLET (160 MG TOTAL) BY MOUTH DAILY. (NEEDS TO BE SEEN BEFORE NEXT REFILL)   glimepiride (AMARYL) 2 MG tablet TAKE 1 TABLET BY MOUTH EVERY DAY BEFORE BREAKFAST  (NEEDS TO BE SEEN BEFORE NEXT REFILL)   glucose blood (ACCU-CHEK GUIDE) test strip Check BS up to 4 times daily Dx E11.9   hydrochlorothiazide (HYDRODIURIL) 25 MG tablet Take 1 tablet (25 mg total) by mouth daily.   LANCETS MICRO THIN 33G MISC Use to check BG once daily.  Dispense whichever goes with meter and that insurance will cover.   lisinopril (ZESTRIL) 40 MG tablet Take 1 tablet (40 mg total) by mouth daily. (NEEDS TO BE SEEN BEFORE NEXT REFILL)   metFORMIN (GLUCOPHAGE) 1000 MG tablet Take 1 tablet (1,000 mg total) by mouth 2 (two) times daily with a meal.   Facility-Administered Encounter Medications as of 01/20/2022  Medication   cyanocobalamin ((VITAMIN B-12)) injection 1,000 mcg    Past Surgical History:  Procedure Laterality Date   ABDOMINAL HYSTERECTOMY     TONSILLECTOMY AND ADENOIDECTOMY      Family History  Problem Relation Age of Onset   COPD Father       Controlled substance contract: n/a     Review of  Systems  Constitutional:  Negative for diaphoresis.  Eyes:  Negative for pain.  Respiratory:  Negative for shortness of breath.   Cardiovascular:  Negative for chest pain, palpitations and leg swelling.  Gastrointestinal:  Negative for abdominal pain.  Endocrine: Negative for polydipsia.  Skin:  Negative for rash.  Neurological:  Negative for dizziness, weakness and headaches.  Hematological:  Does not bruise/bleed easily.  All other systems reviewed and are negative.     Objective:   Physical Exam Vitals and nursing note reviewed.  Constitutional:      General: She is not in acute distress.    Appearance: Normal appearance. She is  well-developed.  HENT:     Head: Normocephalic.     Right Ear: Tympanic membrane normal.     Left Ear: Tympanic membrane normal.     Nose: Nose normal.     Mouth/Throat:     Mouth: Mucous membranes are moist.  Eyes:     Pupils: Pupils are equal, round, and reactive to light.  Neck:     Vascular: No carotid bruit or JVD.  Cardiovascular:     Rate and Rhythm: Normal rate and regular rhythm.     Heart sounds: Normal heart sounds.  Pulmonary:     Effort: Pulmonary effort is normal. No respiratory distress.     Breath sounds: Normal breath sounds. No wheezing or rales.  Chest:     Chest wall: No tenderness.  Abdominal:     General: Bowel sounds are normal. There is no distension or abdominal bruit.     Palpations: Abdomen is soft. There is no hepatomegaly, splenomegaly, mass or pulsatile mass.     Tenderness: There is no abdominal tenderness.  Musculoskeletal:        General: Normal range of motion.     Cervical back: Normal range of motion and neck supple.  Lymphadenopathy:     Cervical: No cervical adenopathy.  Skin:    General: Skin is warm and dry.  Neurological:     Mental Status: She is alert and oriented to person, place, and time.     Deep Tendon Reflexes: Reflexes are normal and symmetric.  Psychiatric:        Behavior: Behavior normal.        Thought Content: Thought content normal.        Judgment: Judgment normal.   BP 129/64    Pulse 77    Temp 97.7 F (36.5 C) (Temporal)    Resp 20    Ht '5\' 5"'  (1.651 m)    Wt 195 lb (88.5 kg)    SpO2 99%    BMI 32.45 kg/m   HGBA1c 6.2%       Assessment & Plan:  [name] comes in today with chief complaint of Medical Management of Chronic Issues   Diagnosis and orders addressed:  1. Essential hypertension, benign Low sodium diet - CBC with Differential/Platelet - CMP14+EGFR - hydrochlorothiazide (HYDRODIURIL) 25 MG tablet; Take 1 tablet (25 mg total) by mouth daily.  Dispense: 90 tablet; Refill: 1 - lisinopril  (ZESTRIL) 40 MG tablet; Take 1 tablet (40 mg total) by mouth daily. (NEEDS TO BE SEEN BEFORE NEXT REFILL)  Dispense: 90 tablet; Refill: 1  2. Hyperlipidemia with target LDL less than 100 Low fat diet - Lipid panel - fenofibrate 160 MG tablet; Take 1 tablet (160 mg total) by mouth daily. (NEEDS TO BE SEEN BEFORE NEXT REFILL)  Dispense: 90 tablet; Refill: 1 - atorvastatin (LIPITOR) 40 MG  tablet; Take 1 tablet (40 mg total) by mouth daily. (NEEDS TO BE SEEN BEFORE NEXT REFILL)  Dispense: 90 tablet; Refill: 1  3. Type 2 diabetes mellitus without complication, without long-term current use of insulin (HCC) Watch carbs in diet - Bayer DCA Hb A1c Waived - Microalbumin / creatinine urine ratio - metFORMIN (GLUCOPHAGE) 1000 MG tablet; Take 1 tablet (1,000 mg total) by mouth 2 (two) times daily with a meal.  Dispense: 180 tablet; Refill: 1 - glimepiride (AMARYL) 2 MG tablet; TAKE 1 TABLET BY MOUTH EVERY DAY BEFORE BREAKFAST  (NEEDS TO BE SEEN BEFORE NEXT REFILL)  Dispense: 90 tablet; Refill: 1  4. Vitamin B12 deficiency anemia due to intrinsic factor deficiency Continue vitamin b12 supplement  5. BMI 37.0-37.9, adult Discussed diet and exercise for person with BMI >25 Will recheck weight in 3-6 months    Labs pending Health Maintenance reviewed- has cologuard and was encouraged to do it. Diet and exercise encouraged  Follow up plan: 6 months   Chanler-Margaret Hassell Done, FNP

## 2022-01-20 NOTE — Addendum Note (Signed)
Addended by: Cleda Daub on: 01/20/2022 03:21 PM   Modules accepted: Orders

## 2022-01-21 LAB — CBC WITH DIFFERENTIAL/PLATELET
Basophils Absolute: 0.1 10*3/uL (ref 0.0–0.2)
Basos: 1 %
EOS (ABSOLUTE): 0.2 10*3/uL (ref 0.0–0.4)
Eos: 2 %
Hematocrit: 40.5 % (ref 34.0–46.6)
Hemoglobin: 13.3 g/dL (ref 11.1–15.9)
Immature Grans (Abs): 0 10*3/uL (ref 0.0–0.1)
Immature Granulocytes: 0 %
Lymphocytes Absolute: 3.2 10*3/uL — ABNORMAL HIGH (ref 0.7–3.1)
Lymphs: 28 %
MCH: 29 pg (ref 26.6–33.0)
MCHC: 32.8 g/dL (ref 31.5–35.7)
MCV: 88 fL (ref 79–97)
Monocytes Absolute: 0.6 10*3/uL (ref 0.1–0.9)
Monocytes: 5 %
Neutrophils Absolute: 7.4 10*3/uL — ABNORMAL HIGH (ref 1.4–7.0)
Neutrophils: 64 %
Platelets: 545 10*3/uL — ABNORMAL HIGH (ref 150–450)
RBC: 4.58 x10E6/uL (ref 3.77–5.28)
RDW: 12.7 % (ref 11.7–15.4)
WBC: 11.5 10*3/uL — ABNORMAL HIGH (ref 3.4–10.8)

## 2022-01-21 LAB — LIPID PANEL
Chol/HDL Ratio: 3.1 ratio (ref 0.0–4.4)
Cholesterol, Total: 164 mg/dL (ref 100–199)
HDL: 53 mg/dL (ref >39)
LDL Chol Calc (NIH): 88 mg/dL (ref 0–99)
Triglycerides: 133 mg/dL (ref 0–149)
VLDL Cholesterol Cal: 23 mg/dL (ref 5–40)

## 2022-01-21 LAB — CMP14+EGFR
ALT: 17 IU/L (ref 0–32)
AST: 24 IU/L (ref 0–40)
Albumin/Globulin Ratio: 1.9 (ref 1.2–2.2)
Albumin: 4.9 g/dL — ABNORMAL HIGH (ref 3.8–4.8)
Alkaline Phosphatase: 65 IU/L (ref 44–121)
BUN/Creatinine Ratio: 22 (ref 12–28)
BUN: 23 mg/dL (ref 8–27)
Bilirubin Total: 0.3 mg/dL (ref 0.0–1.2)
CO2: 22 mmol/L (ref 20–29)
Calcium: 10.1 mg/dL (ref 8.7–10.3)
Chloride: 98 mmol/L (ref 96–106)
Creatinine, Ser: 1.06 mg/dL — ABNORMAL HIGH (ref 0.57–1.00)
Globulin, Total: 2.6 g/dL (ref 1.5–4.5)
Glucose: 75 mg/dL (ref 70–99)
Potassium: 4.6 mmol/L (ref 3.5–5.2)
Sodium: 137 mmol/L (ref 134–144)
Total Protein: 7.5 g/dL (ref 6.0–8.5)
eGFR: 57 mL/min/{1.73_m2} — ABNORMAL LOW (ref >59)

## 2022-01-21 LAB — MICROALBUMIN / CREATININE URINE RATIO
Creatinine, Urine: 112.5 mg/dL
Microalb/Creat Ratio: 11 mg/g creat (ref 0–29)
Microalbumin, Urine: 12.6 ug/mL

## 2022-02-02 ENCOUNTER — Ambulatory Visit (INDEPENDENT_AMBULATORY_CARE_PROVIDER_SITE_OTHER): Payer: BC Managed Care – PPO

## 2022-02-02 DIAGNOSIS — E538 Deficiency of other specified B group vitamins: Secondary | ICD-10-CM | POA: Diagnosis not present

## 2022-02-02 NOTE — Progress Notes (Signed)
Cyanocobalamin injection given to right deltoid.  Patient tolerated well. 

## 2022-02-11 DIAGNOSIS — H2511 Age-related nuclear cataract, right eye: Secondary | ICD-10-CM | POA: Diagnosis not present

## 2022-02-11 DIAGNOSIS — H2513 Age-related nuclear cataract, bilateral: Secondary | ICD-10-CM | POA: Diagnosis not present

## 2022-02-11 DIAGNOSIS — H25013 Cortical age-related cataract, bilateral: Secondary | ICD-10-CM | POA: Diagnosis not present

## 2022-02-11 DIAGNOSIS — H35372 Puckering of macula, left eye: Secondary | ICD-10-CM | POA: Diagnosis not present

## 2022-02-11 DIAGNOSIS — H25043 Posterior subcapsular polar age-related cataract, bilateral: Secondary | ICD-10-CM | POA: Diagnosis not present

## 2022-02-20 ENCOUNTER — Other Ambulatory Visit: Payer: Self-pay

## 2022-02-20 ENCOUNTER — Encounter (INDEPENDENT_AMBULATORY_CARE_PROVIDER_SITE_OTHER): Payer: BC Managed Care – PPO | Admitting: Ophthalmology

## 2022-02-20 DIAGNOSIS — H43813 Vitreous degeneration, bilateral: Secondary | ICD-10-CM

## 2022-02-20 DIAGNOSIS — H35033 Hypertensive retinopathy, bilateral: Secondary | ICD-10-CM

## 2022-02-20 DIAGNOSIS — I10 Essential (primary) hypertension: Secondary | ICD-10-CM

## 2022-02-20 DIAGNOSIS — H2513 Age-related nuclear cataract, bilateral: Secondary | ICD-10-CM

## 2022-02-20 DIAGNOSIS — H35372 Puckering of macula, left eye: Secondary | ICD-10-CM | POA: Diagnosis not present

## 2022-03-03 ENCOUNTER — Ambulatory Visit (INDEPENDENT_AMBULATORY_CARE_PROVIDER_SITE_OTHER): Payer: BC Managed Care – PPO

## 2022-03-03 DIAGNOSIS — E538 Deficiency of other specified B group vitamins: Secondary | ICD-10-CM | POA: Diagnosis not present

## 2022-03-03 NOTE — Progress Notes (Signed)
Cyanocobalamin injection given to left deltoid.  Patient tolerated well. 

## 2022-03-23 ENCOUNTER — Encounter: Payer: Self-pay | Admitting: Nurse Practitioner

## 2022-03-23 ENCOUNTER — Ambulatory Visit (INDEPENDENT_AMBULATORY_CARE_PROVIDER_SITE_OTHER): Payer: BC Managed Care – PPO | Admitting: Nurse Practitioner

## 2022-03-23 VITALS — BP 131/77 | HR 81 | Temp 99.2°F | Ht 65.0 in | Wt 191.0 lb

## 2022-03-23 DIAGNOSIS — I10 Essential (primary) hypertension: Secondary | ICD-10-CM

## 2022-03-23 NOTE — Patient Instructions (Signed)

## 2022-03-23 NOTE — Progress Notes (Signed)
? ?Acute Office Visit ? ?Subjective:  ? ? Patient ID: Emma Nunez, female    DOB: [birth year], 71 y.o.   MRN: [id number] ? ?Chief Complaint  ?Patient presents with  ? Hypertension  ? ? ?Hypertension ?This is a chronic problem. The problem has been resolved since onset. The problem is controlled. Pertinent negatives include no malaise/fatigue. (Anxiety due to upcoming medical procedure.)  ? ? ?Patient presents for follow up of hypertension. Patient was diagnosed in 10/29/2014. The patient is tolerating the medication well without side effects. Compliance with treatment has been good; including taking medication as directed , maintains a healthy diet and regular exercise regimen , and following up as directed.  Patient reports that her  blood pressure have been elevated  in the past 2 days. Which could be due to anticipated upcoming surgical procedure   ? ?Past Medical History:  ?Diagnosis Date  ? Diabetes mellitus without complication ([place])   ? ? ?Past Surgical History:  ?Procedure Laterality Date  ? ABDOMINAL HYSTERECTOMY    ? TONSILLECTOMY AND ADENOIDECTOMY    ? ? ?Family History  ?Problem Relation Age of Onset  ? COPD Father   ? ? ?Social History  ? ?Socioeconomic History  ? Marital status: Widowed  ?  Spouse name: Not on file  ? Number of children: 2  ? Years of education: Not on file  ? Highest education level: Not on file  ?Occupational History  ? Not on file  ?Tobacco Use  ? Smoking status: Never  ? Smokeless tobacco: Never  ?Substance and Sexual Activity  ? Alcohol use: No  ?  Alcohol/week: 0.0 standard drinks  ? Drug use: No  ? Sexual activity: Not on file  ?Other Topics Concern  ? Not on file  ?Social History Narrative  ? 2 daughters  ? 5 grandchildren  ? ?Social Determinants of Health  ? ?Financial Resource Strain: Low Risk   ? Difficulty of Paying Living Expenses: Not hard at all  ?Food Insecurity: Unknown  ? Worried About Charity fundraiser in the Last Year: Not on file  ? Ran Out of Food in the Last Year:  Never true  ?Transportation Needs: No Transportation Needs  ? Lack of Transportation (Medical): No  ? Lack of Transportation (Non-Medical): No  ?Physical Activity: Insufficiently Active  ? Days of Exercise per Week: 3 days  ? Minutes of Exercise per Session: 20 min  ?Stress: No Stress Concern Present  ? Feeling of Stress : Not at all  ?Social Connections: Socially Isolated  ? Frequency of Communication with Friends and Family: More than three times a week  ? Frequency of Social Gatherings with Friends and Family: More than three times a week  ? Attends Religious Services: Never  ? Active Member of Clubs or Organizations: No  ? Attends Archivist Meetings: Never  ? Marital Status: Widowed  ?Intimate Partner Violence: Not At Risk  ? Fear of Current or Ex-Partner: No  ? Emotionally Abused: No  ? Physically Abused: No  ? Sexually Abused: No  ? ? ?Outpatient Medications Prior to Visit  ?Medication Sig Dispense Refill  ? atorvastatin (LIPITOR) 40 MG tablet Take 1 tablet (40 mg total) by mouth daily. (NEEDS TO BE SEEN BEFORE NEXT REFILL) 90 tablet 1  ? blood glucose meter kit and supplies Dispense based on patient and insurance preference. Use up to four times daily as directed. (FOR ICD-10 E10.9, E11.9). 1 each 0  ? Blood Glucose Monitoring Suppl (CONTOUR  NEXT MONITOR) w/Device KIT USE AS DIRECTED 1 kit 0  ? DUREZOL 0.05 % EMUL Place 1 drop into the right eye 3 (three) times daily.    ? fenofibrate 160 MG tablet Take 1 tablet (160 mg total) by mouth daily. (NEEDS TO BE SEEN BEFORE NEXT REFILL) 90 tablet 1  ? glimepiride (AMARYL) 2 MG tablet TAKE 1 TABLET BY MOUTH EVERY DAY BEFORE BREAKFAST  (NEEDS TO BE SEEN BEFORE NEXT REFILL) 90 tablet 1  ? glucose blood (ACCU-CHEK GUIDE) test strip Check BS up to 4 times daily Dx E11.9 400 strip 3  ? hydrochlorothiazide (HYDRODIURIL) 25 MG tablet Take 1 tablet (25 mg total) by mouth daily. 90 tablet 1  ? LANCETS MICRO THIN 33G MISC Use to check BG once daily.  Dispense  whichever goes with meter and that insurance will cover. 100 each 3  ? lisinopril (ZESTRIL) 40 MG tablet Take 1 tablet (40 mg total) by mouth daily. (NEEDS TO BE SEEN BEFORE NEXT REFILL) 90 tablet 1  ? LOTEMAX 0.5 % ophthalmic suspension 1 drop 3 (three) times daily.    ? metFORMIN (GLUCOPHAGE) 1000 MG tablet Take 1 tablet (1,000 mg total) by mouth 2 (two) times daily with a meal. 180 tablet 1  ? PROLENSA 0.07 % SOLN Apply 1 drop to eye 2 (two) times daily.    ? scopolamine (TRANSDERM-SCOP) 1 MG/3DAYS Place 1 patch (1.5 mg total) onto the skin every 3 (three) days. 10 patch 1  ? ?Facility-Administered Medications Prior to Visit  ?Medication Dose Route Frequency Provider Last Rate Last Admin  ? cyanocobalamin ((VITAMIN B-12)) injection 1,000 mcg  1,000 mcg Intramuscular Q30 days Lysbeth Penner, FNP   1,000 mcg at 03/03/22 2130  ? ? ?No Known Allergies ? ?Review of Systems  ?Constitutional: Negative.  Negative for malaise/fatigue.  ?HENT: Negative.    ?Eyes: Negative.   ?Respiratory: Negative.    ?Cardiovascular: Negative.   ?Gastrointestinal: Negative.   ?Genitourinary: Negative.   ?Musculoskeletal: Negative.   ?Skin: Negative.  Negative for rash.  ?All other systems reviewed and are negative. ? ?   ?Objective:  ?  ?Physical Exam ?Vitals and nursing note reviewed.  ?Constitutional:   ?   Appearance: Normal appearance.  ?HENT:  ?   Head: Normocephalic.  ?   Right Ear: External ear normal.  ?   Left Ear: External ear normal.  ?   Nose: Nose normal.  ?   Mouth/Throat:  ?   Mouth: Mucous membranes are moist.  ?   Pharynx: Oropharynx is clear.  ?Eyes:  ?   Conjunctiva/sclera: Conjunctivae normal.  ?Cardiovascular:  ?   Rate and Rhythm: Normal rate.  ?   Pulses: Normal pulses.  ?   Heart sounds: Normal heart sounds.  ?Pulmonary:  ?   Effort: Pulmonary effort is normal.  ?   Breath sounds: Normal breath sounds.  ?Abdominal:  ?   General: Bowel sounds are normal.  ?Neurological:  ?   General: No focal deficit present.  ?    Mental Status: She is alert and oriented to person, place, and time.  ?Psychiatric:     ?   Mood and Affect: Mood normal.     ?   Behavior: Behavior normal.  ? ? ?BP 131/77   Pulse 81   Temp 99.2 ?F (37.3 ?C)   Ht _0  (1.651 m)   Wt 191 lb (86.6 kg)   SpO2 96%   BMI 31.78 kg/m?  ?Wt Readings from Last  3 Encounters:  ?03/23/22 191 lb (86.6 kg)  ?01/20/22 195 lb (88.5 kg)  ?11/17/21 199 lb (90.3 kg)  ? ? ?Health Maintenance Due  ?Topic Date Due  ? Fecal DNA (Cologuard)  Never done  ? COVID-19 Vaccine (3 - Booster for Moderna series) 01/15/2021  ? DEXA SCAN  01/28/2022  ? ? ?There are no preventive care reminders to display for this patient. ? ? ?Lab Results  ?Component Value Date  ? TSH 1.780 10/26/2014  ? ?Lab Results  ?Component Value Date  ? WBC 11.5 (H) 01/20/2022  ? HGB 13.3 01/20/2022  ? HCT 40.5 01/20/2022  ? MCV 88 01/20/2022  ? PLT 545 (H) 01/20/2022  ? ?Lab Results  ?Component Value Date  ? NA 137 01/20/2022  ? K 4.6 01/20/2022  ? CO2 22 01/20/2022  ? GLUCOSE 75 01/20/2022  ? BUN 23 01/20/2022  ? CREATININE 1.06 (H) 01/20/2022  ? BILITOT 0.3 01/20/2022  ? ALKPHOS 65 01/20/2022  ? AST 24 01/20/2022  ? ALT 17 01/20/2022  ? PROT 7.5 01/20/2022  ? ALBUMIN 4.9 (H) 01/20/2022  ? CALCIUM 10.1 01/20/2022  ? EGFR 57 (L) 01/20/2022  ? ?Lab Results  ?Component Value Date  ? CHOL 164 01/20/2022  ? ?Lab Results  ?Component Value Date  ? HDL 53 01/20/2022  ? ?Lab Results  ?Component Value Date  ? [place] 88 01/20/2022  ? ?Lab Results  ?Component Value Date  ? TRIG 133 01/20/2022  ? ?Lab Results  ?Component Value Date  ? CHOLHDL 3.1 01/20/2022  ? ?Lab Results  ?Component Value Date  ? HGBA1C 6.2 (H) 01/20/2022  ? ? ?   ?Assessment & Plan:  ?Blood pressure within normal limits in clinic today.  114/61, repeat 126/77.  Patient will continue current blood pressure medication.  Bring blood pressure machine for calibration and battery changes. ? ?Patient knows to follow-up with worsening or worsening  symptoms. ? ?Problem List Items Addressed This Visit   ?None ? ? ? ?No orders of the defined types were placed in this encounter. ? ? ? ?Ivy Lynn, NP ? ?

## 2022-03-24 DIAGNOSIS — H2511 Age-related nuclear cataract, right eye: Secondary | ICD-10-CM | POA: Diagnosis not present

## 2022-03-24 DIAGNOSIS — H2512 Age-related nuclear cataract, left eye: Secondary | ICD-10-CM | POA: Diagnosis not present

## 2022-03-31 DIAGNOSIS — H2512 Age-related nuclear cataract, left eye: Secondary | ICD-10-CM | POA: Diagnosis not present

## 2022-04-08 ENCOUNTER — Ambulatory Visit: Payer: BC Managed Care – PPO

## 2022-04-08 DIAGNOSIS — H04123 Dry eye syndrome of bilateral lacrimal glands: Secondary | ICD-10-CM | POA: Diagnosis not present

## 2022-04-14 ENCOUNTER — Ambulatory Visit (INDEPENDENT_AMBULATORY_CARE_PROVIDER_SITE_OTHER): Payer: BC Managed Care – PPO

## 2022-04-14 DIAGNOSIS — E538 Deficiency of other specified B group vitamins: Secondary | ICD-10-CM | POA: Diagnosis not present

## 2022-04-14 NOTE — Progress Notes (Signed)
B12 injection given in right deltoid without difficulty. Pt tolerated well. Schedule next injection for 5/25. ?

## 2022-04-21 DIAGNOSIS — H04123 Dry eye syndrome of bilateral lacrimal glands: Secondary | ICD-10-CM | POA: Diagnosis not present

## 2022-04-21 DIAGNOSIS — L719 Rosacea, unspecified: Secondary | ICD-10-CM | POA: Diagnosis not present

## 2022-05-14 ENCOUNTER — Ambulatory Visit (INDEPENDENT_AMBULATORY_CARE_PROVIDER_SITE_OTHER): Payer: BC Managed Care – PPO

## 2022-05-14 DIAGNOSIS — E538 Deficiency of other specified B group vitamins: Secondary | ICD-10-CM | POA: Diagnosis not present

## 2022-05-14 DIAGNOSIS — D510 Vitamin B12 deficiency anemia due to intrinsic factor deficiency: Secondary | ICD-10-CM

## 2022-05-14 NOTE — Progress Notes (Signed)
Patient came in for b12 injection - given in lt deltoid. Patient tolerated well.

## 2022-06-02 ENCOUNTER — Other Ambulatory Visit: Payer: Self-pay | Admitting: Nurse Practitioner

## 2022-06-02 DIAGNOSIS — E119 Type 2 diabetes mellitus without complications: Secondary | ICD-10-CM

## 2022-06-15 ENCOUNTER — Ambulatory Visit (INDEPENDENT_AMBULATORY_CARE_PROVIDER_SITE_OTHER): Payer: BC Managed Care – PPO | Admitting: *Deleted

## 2022-06-15 DIAGNOSIS — E538 Deficiency of other specified B group vitamins: Secondary | ICD-10-CM | POA: Diagnosis not present

## 2022-06-29 ENCOUNTER — Encounter (INDEPENDENT_AMBULATORY_CARE_PROVIDER_SITE_OTHER): Payer: BC Managed Care – PPO | Admitting: Ophthalmology

## 2022-06-29 DIAGNOSIS — H35372 Puckering of macula, left eye: Secondary | ICD-10-CM

## 2022-06-29 DIAGNOSIS — I10 Essential (primary) hypertension: Secondary | ICD-10-CM | POA: Diagnosis not present

## 2022-06-29 DIAGNOSIS — H43813 Vitreous degeneration, bilateral: Secondary | ICD-10-CM

## 2022-06-29 DIAGNOSIS — H35033 Hypertensive retinopathy, bilateral: Secondary | ICD-10-CM | POA: Diagnosis not present

## 2022-06-29 DIAGNOSIS — H353132 Nonexudative age-related macular degeneration, bilateral, intermediate dry stage: Secondary | ICD-10-CM | POA: Diagnosis not present

## 2022-07-16 ENCOUNTER — Ambulatory Visit (INDEPENDENT_AMBULATORY_CARE_PROVIDER_SITE_OTHER): Payer: BC Managed Care – PPO

## 2022-07-16 DIAGNOSIS — E538 Deficiency of other specified B group vitamins: Secondary | ICD-10-CM | POA: Diagnosis not present

## 2022-07-16 NOTE — Progress Notes (Signed)
Cyanocobalamin injection given to left deltoid.  Patient tolerated well. 

## 2022-07-17 ENCOUNTER — Encounter: Payer: Self-pay | Admitting: Nurse Practitioner

## 2022-07-17 ENCOUNTER — Ambulatory Visit (INDEPENDENT_AMBULATORY_CARE_PROVIDER_SITE_OTHER): Payer: BC Managed Care – PPO | Admitting: Nurse Practitioner

## 2022-07-17 VITALS — BP 137/77 | HR 85 | Temp 97.7°F | Resp 20 | Ht 65.0 in | Wt 192.0 lb

## 2022-07-17 DIAGNOSIS — Z6837 Body mass index (BMI) 37.0-37.9, adult: Secondary | ICD-10-CM | POA: Diagnosis not present

## 2022-07-17 DIAGNOSIS — I10 Essential (primary) hypertension: Secondary | ICD-10-CM | POA: Diagnosis not present

## 2022-07-17 DIAGNOSIS — D510 Vitamin B12 deficiency anemia due to intrinsic factor deficiency: Secondary | ICD-10-CM | POA: Diagnosis not present

## 2022-07-17 DIAGNOSIS — E785 Hyperlipidemia, unspecified: Secondary | ICD-10-CM

## 2022-07-17 DIAGNOSIS — E119 Type 2 diabetes mellitus without complications: Secondary | ICD-10-CM | POA: Diagnosis not present

## 2022-07-17 LAB — BAYER DCA HB A1C WAIVED: HB A1C (BAYER DCA - WAIVED): 5.7 % — ABNORMAL HIGH (ref 4.8–5.6)

## 2022-07-17 MED ORDER — GLIMEPIRIDE 2 MG PO TABS: ORAL_TABLET | ORAL | 1 refills | Status: DC

## 2022-07-17 MED ORDER — ATORVASTATIN CALCIUM 40 MG PO TABS: 40.0000 mg | ORAL_TABLET | Freq: Every day | ORAL | 1 refills | Status: DC

## 2022-07-17 MED ORDER — HYDROCHLOROTHIAZIDE 25 MG PO TABS: 25.0000 mg | ORAL_TABLET | Freq: Every day | ORAL | 1 refills | Status: DC

## 2022-07-17 MED ORDER — METFORMIN HCL 1000 MG PO TABS: 1000.0000 mg | ORAL_TABLET | Freq: Two times a day (BID) | ORAL | 1 refills | Status: DC

## 2022-07-17 MED ORDER — LISINOPRIL 40 MG PO TABS: 40.0000 mg | ORAL_TABLET | Freq: Every day | ORAL | 1 refills | Status: DC

## 2022-07-17 MED ORDER — FENOFIBRATE 160 MG PO TABS: 160.0000 mg | ORAL_TABLET | Freq: Every day | ORAL | 1 refills | Status: DC

## 2022-07-17 NOTE — Progress Notes (Signed)
Subjective:    Patient ID: Emma Nunez, female    DOB: Jun 15, 1951, 71 y.o.   MRN: [id number]   Chief Complaint: medical management of chronic issues     HPI:  Emma Nunez is a 71 y.o. who identifies as a female who was assigned female at birth.   Social history: Lives with: husband Work history: retired   Scientist, forensic in today for follow up of the following chronic medical issues:  1. Essential hypertension, benign No c/o chest pain, sob or headache. Does not check blood pressure at home. BP Readings from Last 3 Encounters:  03/23/22 131/77  01/20/22 129/64  07/17/21 (!) 144/82     2. Hyperlipidemia with target LDL less than 100 Does not really watch diet and does no dedicated exercise. Lab Results  Component Value Date   CHOL 164 01/20/2022   HDL 53 01/20/2022   LDLCALC 88 01/20/2022   TRIG 133 01/20/2022   CHOLHDL 3.1 01/20/2022   The 10-year ASCVD risk score (Arnett DK, et al., 2019) is: 22.8%   3. Type 2 diabetes mellitus without complication, without long-term current use of insulin (HCC) She checks occasionally and is ususally in the 120. No low blood sugars. Lab Results  Component Value Date   HGBA1C 6.2 (H) 01/20/2022     4. Vitamin B12 deficiency anemia due to intrinsic factor deficiency Is on monthly b12 injections  5. BMI 37.0-37.9, adult No recent weight changes Wt Readings from Last 3 Encounters:  07/17/22 192 lb (87.1 kg)  03/23/22 191 lb (86.6 kg)  01/20/22 195 lb (88.5 kg)   BMI Readings from Last 3 Encounters:  07/17/22 31.95 kg/m  03/23/22 31.78 kg/m  01/20/22 32.45 kg/m     New complaints: None today  No Known Allergies Outpatient Encounter Medications as of 07/17/2022  Medication Sig   atorvastatin (LIPITOR) 40 MG tablet Take 1 tablet (40 mg total) by mouth daily. (NEEDS TO BE SEEN BEFORE NEXT REFILL)   blood glucose meter kit and supplies Dispense based on patient and insurance preference. Use up to four times daily as  directed. (FOR ICD-10 E10.9, E11.9).   Blood Glucose Monitoring Suppl (CONTOUR NEXT MONITOR) w/Device KIT USE AS DIRECTED   DUREZOL 0.05 % EMUL Place 1 drop into the right eye 3 (three) times daily.   fenofibrate 160 MG tablet Take 1 tablet (160 mg total) by mouth daily. (NEEDS TO BE SEEN BEFORE NEXT REFILL)   glimepiride (AMARYL) 2 MG tablet TAKE 1 TABLET BY MOUTH EVERY DAY BEFORE BREAKFAST   glucose blood (ACCU-CHEK GUIDE) test strip Check BS up to 4 times daily Dx E11.9   hydrochlorothiazide (HYDRODIURIL) 25 MG tablet Take 1 tablet (25 mg total) by mouth daily.   LANCETS MICRO THIN 33G MISC Use to check BG once daily.  Dispense whichever goes with meter and that insurance will cover.   lisinopril (ZESTRIL) 40 MG tablet Take 1 tablet (40 mg total) by mouth daily. (NEEDS TO BE SEEN BEFORE NEXT REFILL)   LOTEMAX 0.5 % ophthalmic suspension 1 drop 3 (three) times daily.   metFORMIN (GLUCOPHAGE) 1000 MG tablet Take 1 tablet (1,000 mg total) by mouth 2 (two) times daily with a meal.   PROLENSA 0.07 % SOLN Apply 1 drop to eye 2 (two) times daily.   scopolamine (TRANSDERM-SCOP) 1 MG/3DAYS Place 1 patch (1.5 mg total) onto the skin every 3 (three) days.   Facility-Administered Encounter Medications as of 07/17/2022  Medication   cyanocobalamin ((VITAMIN B-12)) injection  1,000 mcg    Past Surgical History:  Procedure Laterality Date   ABDOMINAL HYSTERECTOMY     TONSILLECTOMY AND ADENOIDECTOMY      Family History  Problem Relation Age of Onset   COPD Father       Controlled substance contract: n/a     Review of Systems  Constitutional:  Negative for diaphoresis.  Eyes:  Negative for pain.  Respiratory:  Negative for shortness of breath.   Cardiovascular:  Negative for chest pain, palpitations and leg swelling.  Gastrointestinal:  Negative for abdominal pain.  Endocrine: Negative for polydipsia.  Skin:  Negative for rash.  Neurological:  Negative for dizziness, weakness and  headaches.  Hematological:  Does not bruise/bleed easily.  All other systems reviewed and are negative.      Objective:   Physical Exam Vitals and nursing note reviewed.  Constitutional:      General: She is not in acute distress.    Appearance: Normal appearance. She is well-developed.  HENT:     Head: Normocephalic.     Right Ear: Tympanic membrane normal.     Left Ear: Tympanic membrane normal.     Nose: Nose normal.     Mouth/Throat:     Mouth: Mucous membranes are moist.  Eyes:     Pupils: Pupils are equal, round, and reactive to light.  Neck:     Vascular: No carotid bruit or JVD.  Cardiovascular:     Rate and Rhythm: Normal rate and regular rhythm.     Heart sounds: Normal heart sounds.  Pulmonary:     Effort: Pulmonary effort is normal. No respiratory distress.     Breath sounds: Normal breath sounds. No wheezing or rales.  Chest:     Chest wall: No tenderness.  Abdominal:     General: Bowel sounds are normal. There is no distension or abdominal bruit.     Palpations: Abdomen is soft. There is no hepatomegaly, splenomegaly, mass or pulsatile mass.     Tenderness: There is no abdominal tenderness.  Musculoskeletal:        General: Normal range of motion.     Cervical back: Normal range of motion and neck supple.  Lymphadenopathy:     Cervical: No cervical adenopathy.  Skin:    General: Skin is warm and dry.  Neurological:     Mental Status: She is alert and oriented to person, place, and time.     Deep Tendon Reflexes: Reflexes are normal and symmetric.  Psychiatric:        Behavior: Behavior normal.        Thought Content: Thought content normal.        Judgment: Judgment normal.     BP 137/77   Pulse 85   Temp 97.7 F (36.5 C) (Temporal)   Resp 20   Ht '5\' 5"'  (1.651 m)   Wt 192 lb (87.1 kg)   SpO2 97%   BMI 31.95 kg/m   Hgba1c 5.7%     Assessment & Plan:   Emma Nunez comes in today with chief complaint of Medical Management of Chronic  Issues   Diagnosis and orders addressed:  1. Essential hypertension, benign Low sodium diet - CBC with Differential/Platelet - CMP14+EGFR - lisinopril (ZESTRIL) 40 MG tablet; Take 1 tablet (40 mg total) by mouth daily. (NEEDS TO BE SEEN BEFORE NEXT REFILL)  Dispense: 90 tablet; Refill: 1 - hydrochlorothiazide (HYDRODIURIL) 25 MG tablet; Take 1 tablet (25 mg total) by mouth daily.  Dispense:  90 tablet; Refill: 1  2. Hyperlipidemia with target LDL less than 100 Low fat diet - Lipid panel - atorvastatin (LIPITOR) 40 MG tablet; Take 1 tablet (40 mg total) by mouth daily. (NEEDS TO BE SEEN BEFORE NEXT REFILL)  Dispense: 90 tablet; Refill: 1 - fenofibrate 160 MG tablet; Take 1 tablet (160 mg total) by mouth daily. (NEEDS TO BE SEEN BEFORE NEXT REFILL)  Dispense: 90 tablet; Refill: 1  3. Type 2 diabetes mellitus without complication, without long-term current use of insulin (HCC) Continue to watch carbs in diet - Bayer DCA Hb A1c Waived - glimepiride (AMARYL) 2 MG tablet; TAKE 1 TABLET BY MOUTH EVERY DAY BEFORE BREAKFAST  Dispense: 90 tablet; Refill: 1 - metFORMIN (GLUCOPHAGE) 1000 MG tablet; Take 1 tablet (1,000 mg total) by mouth 2 (two) times daily with a meal.  Dispense: 180 tablet; Refill: 1  4. Vitamin B12 deficiency anemia due to intrinsic factor deficiency B12 injections  5. BMI 37.0-37.9, adult Discussed diet and exercise for person with BMI >25 Will recheck weight in 3-6 months    Labs pending Health Maintenance reviewed Diet and exercise encouraged  Follow up plan: 6 months   [place], FNP

## 2022-07-17 NOTE — Patient Instructions (Signed)
Diabetes Mellitus and Foot Care Foot care is an important part of your health, especially when you have diabetes. Diabetes may cause you to have problems because of poor blood flow (circulation) to your feet and legs, which can cause your skin to: Become thinner and drier. Break more easily. Heal more slowly. Peel and crack. You may also have nerve damage (neuropathy) in your legs and feet, causing decreased feeling in them. This means that you may not notice minor injuries to your feet that could lead to more serious problems. Noticing and addressing any potential problems early is the best way to prevent future foot problems. How to care for your feet Foot hygiene  Wash your feet daily with warm water and mild soap. Do not use hot water. Then, pat your feet and the areas between your toes until they are completely dry. Do not soak your feet as this can dry your skin. Trim your toenails straight across. Do not dig under them or around the cuticle. File the edges of your nails with an emery board or nail file. Apply a moisturizing lotion or petroleum jelly to the skin on your feet and to dry, brittle toenails. Use lotion that does not contain alcohol and is unscented. Do not apply lotion between your toes. Shoes and socks Wear clean socks or stockings every day. Make sure they are not too tight. Do not wear knee-high stockings since they may decrease blood flow to your legs. Wear shoes that fit properly and have enough cushioning. Always look in your shoes before you put them on to be sure there are no objects inside. To break in new shoes, wear them for just a few hours a day. This prevents injuries on your feet. Wounds, scrapes, corns, and calluses  Check your feet daily for blisters, cuts, bruises, sores, and redness. If you cannot see the bottom of your feet, use a mirror or ask someone for help. Do not cut corns or calluses or try to remove them with medicine. If you find a minor scrape,  cut, or break in the skin on your feet, keep it and the skin around it clean and dry. You may clean these areas with mild soap and water. Do not clean the area with peroxide, alcohol, or iodine. If you have a wound, scrape, corn, or callus on your foot, look at it several times a day to make sure it is healing and not infected. Check for: Redness, swelling, or pain. Fluid or blood. Warmth. Pus or a bad smell. General tips Do not cross your legs. This may decrease blood flow to your feet. Do not use heating pads or hot water bottles on your feet. They may burn your skin. If you have lost feeling in your feet or legs, you may not know this is happening until it is too late. Protect your feet from hot and cold by wearing shoes, such as at the beach or on hot pavement. Schedule a complete foot exam at least once a year (annually) or more often if you have foot problems. Report any cuts, sores, or bruises to your health care provider immediately. Where to find more information American Diabetes Association: www.diabetes.org Association of Diabetes Care & Education Specialists: www.diabeteseducator.org Contact a health care provider if: You have a medical condition that increases your risk of infection and you have any cuts, sores, or bruises on your feet. You have an injury that is not healing. You have redness on your legs or feet. You   feel burning or tingling in your legs or feet. You have pain or cramps in your legs and feet. Your legs or feet are numb. Your feet always feel cold. You have pain around any toenails. Get help right away if: You have a wound, scrape, corn, or callus on your foot and: You have pain, swelling, or redness that gets worse. You have fluid or blood coming from the wound, scrape, corn, or callus. Your wound, scrape, corn, or callus feels warm to the touch. You have pus or a bad smell coming from the wound, scrape, corn, or callus. You have a fever. You have a red  line going up your leg. Summary Check your feet every day for blisters, cuts, bruises, sores, and redness. Apply a moisturizing lotion or petroleum jelly to the skin on your feet and to dry, brittle toenails. Wear shoes that fit properly and have enough cushioning. If you have foot problems, report any cuts, sores, or bruises to your health care provider immediately. Schedule a complete foot exam at least once a year (annually) or more often if you have foot problems. This information is not intended to replace advice given to you by your health care provider. Make sure you discuss any questions you have with your health care provider. Document Revised: 06/27/2020 Document Reviewed: 06/27/2020 Elsevier Patient Education  2023 Elsevier Inc.  

## 2022-07-18 LAB — CBC WITH DIFFERENTIAL/PLATELET
Basophils Absolute: 0.1 10*3/uL (ref 0.0–0.2)
Basos: 1 %
EOS (ABSOLUTE): 0.2 10*3/uL (ref 0.0–0.4)
Eos: 2 %
Hematocrit: 37.3 % (ref 34.0–46.6)
Hemoglobin: 12.4 g/dL (ref 11.1–15.9)
Immature Grans (Abs): 0.1 10*3/uL (ref 0.0–0.1)
Immature Granulocytes: 1 %
Lymphocytes Absolute: 2.7 10*3/uL (ref 0.7–3.1)
Lymphs: 26 %
MCH: 29.2 pg (ref 26.6–33.0)
MCHC: 33.2 g/dL (ref 31.5–35.7)
MCV: 88 fL (ref 79–97)
Monocytes Absolute: 0.5 10*3/uL (ref 0.1–0.9)
Monocytes: 5 %
Neutrophils Absolute: 7.2 10*3/uL — ABNORMAL HIGH (ref 1.4–7.0)
Neutrophils: 65 %
Platelets: 496 10*3/uL — ABNORMAL HIGH (ref 150–450)
RBC: 4.25 x10E6/uL (ref 3.77–5.28)
RDW: 13.3 % (ref 11.7–15.4)
WBC: 10.8 10*3/uL (ref 3.4–10.8)

## 2022-07-18 LAB — CMP14+EGFR
ALT: 14 IU/L (ref 0–32)
AST: 18 IU/L (ref 0–40)
Albumin/Globulin Ratio: 2.1 (ref 1.2–2.2)
Albumin: 4.6 g/dL (ref 3.9–4.9)
Alkaline Phosphatase: 77 IU/L (ref 44–121)
BUN/Creatinine Ratio: 18 (ref 12–28)
BUN: 18 mg/dL (ref 8–27)
Bilirubin Total: 0.3 mg/dL (ref 0.0–1.2)
CO2: 22 mmol/L (ref 20–29)
Calcium: 9.8 mg/dL (ref 8.7–10.3)
Chloride: 100 mmol/L (ref 96–106)
Creatinine, Ser: 1.01 mg/dL — ABNORMAL HIGH (ref 0.57–1.00)
Globulin, Total: 2.2 g/dL (ref 1.5–4.5)
Glucose: 85 mg/dL (ref 70–99)
Potassium: 4.5 mmol/L (ref 3.5–5.2)
Sodium: 137 mmol/L (ref 134–144)
Total Protein: 6.8 g/dL (ref 6.0–8.5)
eGFR: 60 mL/min/{1.73_m2} (ref >59)

## 2022-07-18 LAB — LIPID PANEL
Chol/HDL Ratio: 3.4 ratio (ref 0.0–4.4)
Cholesterol, Total: 178 mg/dL (ref 100–199)
HDL: 53 mg/dL (ref >39)
LDL Chol Calc (NIH): 102 mg/dL — ABNORMAL HIGH (ref 0–99)
Triglycerides: 133 mg/dL (ref 0–149)
VLDL Cholesterol Cal: 23 mg/dL (ref 5–40)

## 2022-08-20 ENCOUNTER — Ambulatory Visit (INDEPENDENT_AMBULATORY_CARE_PROVIDER_SITE_OTHER): Payer: BC Managed Care – PPO

## 2022-08-20 DIAGNOSIS — E538 Deficiency of other specified B group vitamins: Secondary | ICD-10-CM | POA: Diagnosis not present

## 2022-09-03 ENCOUNTER — Other Ambulatory Visit: Payer: Self-pay | Admitting: Nurse Practitioner

## 2022-09-03 DIAGNOSIS — E785 Hyperlipidemia, unspecified: Secondary | ICD-10-CM

## 2022-09-18 ENCOUNTER — Ambulatory Visit (INDEPENDENT_AMBULATORY_CARE_PROVIDER_SITE_OTHER): Payer: BC Managed Care – PPO | Admitting: *Deleted

## 2022-09-18 DIAGNOSIS — E538 Deficiency of other specified B group vitamins: Secondary | ICD-10-CM | POA: Diagnosis not present

## 2022-09-18 DIAGNOSIS — Z23 Encounter for immunization: Secondary | ICD-10-CM | POA: Diagnosis not present

## 2022-09-18 NOTE — Progress Notes (Signed)
Pt in for B12 & flu shot, pt tolerated well.

## 2022-09-30 ENCOUNTER — Other Ambulatory Visit: Payer: Self-pay | Admitting: Nurse Practitioner

## 2022-09-30 DIAGNOSIS — E785 Hyperlipidemia, unspecified: Secondary | ICD-10-CM

## 2022-10-01 ENCOUNTER — Other Ambulatory Visit: Payer: Self-pay | Admitting: Nurse Practitioner

## 2022-10-01 DIAGNOSIS — I10 Essential (primary) hypertension: Secondary | ICD-10-CM

## 2022-10-08 ENCOUNTER — Other Ambulatory Visit: Payer: Self-pay | Admitting: Nurse Practitioner

## 2022-10-08 DIAGNOSIS — E119 Type 2 diabetes mellitus without complications: Secondary | ICD-10-CM

## 2022-10-15 LAB — HM DIABETES EYE EXAM

## 2022-10-19 ENCOUNTER — Ambulatory Visit (INDEPENDENT_AMBULATORY_CARE_PROVIDER_SITE_OTHER): Payer: BC Managed Care – PPO

## 2022-10-19 DIAGNOSIS — E538 Deficiency of other specified B group vitamins: Secondary | ICD-10-CM

## 2022-10-19 NOTE — Progress Notes (Signed)
Cyanocobalamin injection given to right deltoid.  Patient tolerated well. 

## 2022-10-21 ENCOUNTER — Other Ambulatory Visit: Payer: Self-pay | Admitting: Nurse Practitioner

## 2022-10-21 DIAGNOSIS — I10 Essential (primary) hypertension: Secondary | ICD-10-CM

## 2022-11-19 ENCOUNTER — Ambulatory Visit: Payer: BC Managed Care – PPO

## 2022-11-24 ENCOUNTER — Ambulatory Visit (INDEPENDENT_AMBULATORY_CARE_PROVIDER_SITE_OTHER): Payer: BC Managed Care – PPO | Admitting: *Deleted

## 2022-11-24 DIAGNOSIS — E538 Deficiency of other specified B group vitamins: Secondary | ICD-10-CM | POA: Diagnosis not present

## 2022-11-24 NOTE — Progress Notes (Signed)
Vitamin b12 injection given and patient tolerated well.  

## 2022-12-25 ENCOUNTER — Ambulatory Visit (INDEPENDENT_AMBULATORY_CARE_PROVIDER_SITE_OTHER): Payer: BC Managed Care – PPO | Admitting: *Deleted

## 2022-12-25 DIAGNOSIS — E538 Deficiency of other specified B group vitamins: Secondary | ICD-10-CM | POA: Diagnosis not present

## 2022-12-25 NOTE — Progress Notes (Signed)
B12 injection given, right deltoid, intramuscular. Patient tolerated well 

## 2023-01-05 ENCOUNTER — Other Ambulatory Visit: Payer: Self-pay | Admitting: Nurse Practitioner

## 2023-01-05 DIAGNOSIS — I10 Essential (primary) hypertension: Secondary | ICD-10-CM

## 2023-01-05 DIAGNOSIS — E785 Hyperlipidemia, unspecified: Secondary | ICD-10-CM

## 2023-01-18 ENCOUNTER — Ambulatory Visit (INDEPENDENT_AMBULATORY_CARE_PROVIDER_SITE_OTHER): Payer: BC Managed Care – PPO | Admitting: Nurse Practitioner

## 2023-01-18 ENCOUNTER — Encounter: Payer: Self-pay | Admitting: Nurse Practitioner

## 2023-01-18 VITALS — BP 142/77 | HR 77 | Temp 98.0°F | Ht 65.0 in | Wt 203.4 lb

## 2023-01-18 DIAGNOSIS — D510 Vitamin B12 deficiency anemia due to intrinsic factor deficiency: Secondary | ICD-10-CM

## 2023-01-18 DIAGNOSIS — E785 Hyperlipidemia, unspecified: Secondary | ICD-10-CM

## 2023-01-18 DIAGNOSIS — I10 Essential (primary) hypertension: Secondary | ICD-10-CM | POA: Diagnosis not present

## 2023-01-18 DIAGNOSIS — Z6837 Body mass index (BMI) 37.0-37.9, adult: Secondary | ICD-10-CM

## 2023-01-18 DIAGNOSIS — E119 Type 2 diabetes mellitus without complications: Secondary | ICD-10-CM | POA: Diagnosis not present

## 2023-01-18 LAB — BAYER DCA HB A1C WAIVED: HB A1C (BAYER DCA - WAIVED): 6.3 % — ABNORMAL HIGH (ref 4.8–5.6)

## 2023-01-18 MED ORDER — LISINOPRIL 40 MG PO TABS: 40.0000 mg | ORAL_TABLET | Freq: Every day | ORAL | 1 refills | Status: DC

## 2023-01-18 MED ORDER — METFORMIN HCL 1000 MG PO TABS: 1000.0000 mg | ORAL_TABLET | Freq: Two times a day (BID) | ORAL | 1 refills | Status: DC

## 2023-01-18 MED ORDER — ATORVASTATIN CALCIUM 40 MG PO TABS: 40.0000 mg | ORAL_TABLET | Freq: Every day | ORAL | 1 refills | Status: DC

## 2023-01-18 MED ORDER — OZEMPIC (0.25 OR 0.5 MG/DOSE) 2 MG/3ML ~~LOC~~ SOPN: PEN_INJECTOR | SUBCUTANEOUS | 3 refills | Status: DC

## 2023-01-18 MED ORDER — FENOFIBRATE 160 MG PO TABS: 160.0000 mg | ORAL_TABLET | Freq: Every day | ORAL | 1 refills | Status: DC

## 2023-01-18 MED ORDER — HYDROCHLOROTHIAZIDE 25 MG PO TABS: 25.0000 mg | ORAL_TABLET | Freq: Every day | ORAL | 1 refills | Status: DC

## 2023-01-18 NOTE — Progress Notes (Signed)
Subjective:    Patient ID: Emma Nunez, female    DOB: 22-Oct-1951, 72 y.o.   MRN: [id number]   Chief Complaint: medical management of chronic issues     HPI:  Emma Nunez is a 72 y.o. who identifies as a female who was assigned female at birth.   Social history: Lives with: husband Work history: retired   Water engineer in today for follow up of the following chronic medical issues:  1. Essential hypertension, benign No c/o chest pain, sob or headache; does not check blood pressure at home. BP Readings from Last 3 Encounters:  07/17/22 137/77  03/23/22 131/77  01/20/22 129/64     2. Hyperlipidemia with target LDL less than 100 Does not watch diet very closely. Does no dedicated exercise. Lab Results  Component Value Date   CHOL 178 07/17/2022   HDL 53 07/17/2022   LDLCALC 102 (H) 07/17/2022   TRIG 133 07/17/2022   CHOLHDL 3.4 07/17/2022     3. Type 2 diabetes mellitus without complication, without long-term current use of insulin (HCC) Fasting blood sugars are running around 100-130. No low blood sugars.  Lab Results  Component Value Date   HGBA1C 5.7 (H) 07/17/2022     4. Vitamin B12 deficiency anemia due to intrinsic factor deficiency Monthly b12 injections  5. BMI 37.0-37.9, adult No recent weight changes Wt Readings from Last 3 Encounters:  07/17/22 192 lb (87.1 kg)  03/23/22 191 lb (86.6 kg)  01/20/22 195 lb (88.5 kg)   BMI Readings from Last 3 Encounters:  07/17/22 31.95 kg/m  03/23/22 31.78 kg/m  01/20/22 32.45 kg/m      New complaints: None today  No Known Allergies Outpatient Encounter Medications as of 01/18/2023  Medication Sig   atorvastatin (LIPITOR) 40 MG tablet Take 1 tablet (40 mg total) by mouth daily.   Blood Glucose Monitoring Suppl (CONTOUR NEXT MONITOR) w/Device KIT USE AS DIRECTED   fenofibrate 160 MG tablet Take 1 tablet (160 mg total) by mouth daily.   glimepiride (AMARYL) 2 MG tablet TAKE 1 TABLET BY MOUTH EVERY DAY BEFORE  BREAKFAST   glucose blood (ACCU-CHEK GUIDE) test strip Check BS up to 4 times daily Dx E11.9   hydrochlorothiazide (HYDRODIURIL) 25 MG tablet TAKE 1 TABLET (25 MG TOTAL) BY MOUTH DAILY.   LANCETS MICRO THIN 33G MISC Use to check BG once daily.  Dispense whichever goes with meter and that insurance will cover.   lisinopril (ZESTRIL) 40 MG tablet Take 1 tablet (40 mg total) by mouth daily.   metFORMIN (GLUCOPHAGE) 1000 MG tablet Take 1 tablet (1,000 mg total) by mouth 2 (two) times daily with a meal.   Facility-Administered Encounter Medications as of 01/18/2023  Medication   cyanocobalamin ((VITAMIN B-12)) injection 1,000 mcg    Past Surgical History:  Procedure Laterality Date   ABDOMINAL HYSTERECTOMY     CATARACT EXTRACTION, BILATERAL     TONSILLECTOMY AND ADENOIDECTOMY      Family History  Problem Relation Age of Onset   COPD Father       Controlled substance contract: n/a     Review of Systems  Constitutional:  Negative for diaphoresis.  Eyes:  Negative for pain.  Respiratory:  Negative for shortness of breath.   Cardiovascular:  Negative for chest pain, palpitations and leg swelling.  Gastrointestinal:  Negative for abdominal pain.  Endocrine: Negative for polydipsia.  Skin:  Negative for rash.  Neurological:  Negative for dizziness, weakness and headaches.  Hematological:  Does not bruise/bleed easily.  All other systems reviewed and are negative.      Objective:   Physical Exam Vitals and nursing note reviewed.  Constitutional:      General: She is not in acute distress.    Appearance: Normal appearance. She is well-developed.  HENT:     Head: Normocephalic.     Right Ear: Tympanic membrane normal.     Left Ear: Tympanic membrane normal.     Nose: Nose normal.     Mouth/Throat:     Mouth: Mucous membranes are moist.  Eyes:     Pupils: Pupils are equal, round, and reactive to light.  Neck:     Vascular: No carotid bruit or JVD.  Cardiovascular:      Rate and Rhythm: Normal rate and regular rhythm.     Heart sounds: Normal heart sounds.  Pulmonary:     Effort: Pulmonary effort is normal. No respiratory distress.     Breath sounds: Normal breath sounds. No wheezing or rales.  Chest:     Chest wall: No tenderness.  Abdominal:     General: Bowel sounds are normal. There is no distension or abdominal bruit.     Palpations: Abdomen is soft. There is no hepatomegaly, splenomegaly, mass or pulsatile mass.     Tenderness: There is no abdominal tenderness.  Musculoskeletal:        General: Normal range of motion.     Cervical back: Normal range of motion and neck supple.  Lymphadenopathy:     Cervical: No cervical adenopathy.  Skin:    General: Skin is warm and dry.  Neurological:     Mental Status: She is alert and oriented to person, place, and time.     Deep Tendon Reflexes: Reflexes are normal and symmetric.  Psychiatric:        Behavior: Behavior normal.        Thought Content: Thought content normal.        Judgment: Judgment normal.     BP (!) 142/77   Pulse 77   Temp 98 F (36.7 C) (Oral)   Ht 5\' 5"  (1.651 m)   Wt 203 lb 6.4 oz (92.3 kg)   SpO2 98%   BMI 33.85 kg/m        Assessment & Plan:  Emma Nunez comes in today with chief complaint of Medical Management of Chronic Issues   Diagnosis and orders addressed:  1. Essential hypertension, benign Low sodium diet - CBC with Differential/Platelet - CMP14+EGFR - lisinopril (ZESTRIL) 40 MG tablet; Take 1 tablet (40 mg total) by mouth daily.  Dispense: 90 tablet; Refill: 1 - hydrochlorothiazide (HYDRODIURIL) 25 MG tablet; Take 1 tablet (25 mg total) by mouth daily.  Dispense: 90 tablet; Refill: 1  2. Hyperlipidemia with target LDL less than 100 Low fat diet - Lipid panel - fenofibrate 160 MG tablet; Take 1 tablet (160 mg total) by mouth daily.  Dispense: 90 tablet; Refill: 1 - atorvastatin (LIPITOR) 40 MG tablet; Take 1 tablet (40 mg total) by mouth daily.   Dispense: 90 tablet; Refill: 1  3. Type 2 diabetes mellitus without complication, without long-term current use of insulin (HCC) Continue to watch carbs in diet Will try to get ozempic for her - continue glimipiride until we get prior auth for ozempic - Bayer DCA Hb A1c Waived - Semaglutide,0.25 or 0.5MG /DOS, (OZEMPIC, 0.25 OR 0.5 MG/DOSE,) 2 MG/3ML SOPN; 0.25mg  weekly for 2 weeks then 0.5mg  weekly  Dispense: 3 mL; Refill:  3 - metFORMIN (GLUCOPHAGE) 1000 MG tablet; Take 1 tablet (1,000 mg total) by mouth 2 (two) times daily with a meal.  Dispense: 180 tablet; Refill: 1  4. Vitamin B12 deficiency anemia due to intrinsic factor deficiency Continue monthly b12 injections  5. BMI 37.0-37.9, adult Discussed diet and exercise for person with BMI >25 Will recheck weight in 3-6 months    Labs pending Health Maintenance reviewed Diet and exercise encouraged  Follow up plan: 3 months   Hettie-Margaret Hassell Done, FNP

## 2023-01-18 NOTE — Patient Instructions (Signed)
Semaglutide Injection What is this medication? SEMAGLUTIDE (SEM a GLOO tide) treats type 2 diabetes. It works by increasing insulin levels in your body, which decreases your blood sugar (glucose). It also reduces the amount of sugar released into the blood and slows down your digestion. It can also be used to lower the risk of heart attack and stroke in people with type 2 diabetes. Changes to diet and exercise are often combined with this medication. This medicine may be used for other purposes; ask your health care provider or pharmacist if you have questions. COMMON BRAND NAME(S): OZEMPIC What should I tell my care team before I take this medication? They need to know if you have any of these conditions: Endocrine tumors (MEN 2) or if someone in your family had these tumors Eye disease, vision problems History of pancreatitis Kidney disease Stomach problems Thyroid cancer or if someone in your family had thyroid cancer An unusual or allergic reaction to semaglutide, other medications, foods, dyes, or preservatives Pregnant or trying to get pregnant Breast-feeding How should I use this medication? This medication is for injection under the skin of your upper leg (thigh), stomach area, or upper arm. It is given once every week (every 7 days). You will be taught how to prepare and give this medication. Use exactly as directed. Take your medication at regular intervals. Do not take it more often than directed. If you use this medication with insulin, you should inject this medication and the insulin separately. Do not mix them together. Do not give the injections right next to each other. Change (rotate) injection sites with each injection. It is important that you put your used needles and syringes in a special sharps container. Do not put them in a trash can. If you do not have a sharps container, call your pharmacist or care team to get one. A special MedGuide will be given to you by the  pharmacist with each prescription and refill. Be sure to read this information carefully each time. This medication comes with INSTRUCTIONS FOR USE. Ask your pharmacist for directions on how to use this medication. Read the information carefully. Talk to your pharmacist or care team if you have questions. Talk to your care team about the use of this medication in children. Special care may be needed. Overdosage: If you think you have taken too much of this medicine contact a poison control center or emergency room at once. NOTE: This medicine is only for you. Do not share this medicine with others. What if I miss a dose? If you miss a dose, take it as soon as you can within 5 days after the missed dose. Then take your next dose at your regular weekly time. If it has been longer than 5 days after the missed dose, do not take the missed dose. Take the next dose at your regular time. Do not take double or extra doses. If you have questions about a missed dose, contact your care team for advice. What may interact with this medication? Other medications for diabetes Many medications may cause changes in blood sugar, these include: Alcohol containing beverages Antiviral medications for HIV or AIDS Aspirin and aspirin-like medications Certain medications for blood pressure, heart disease, irregular heart beat Chromium Diuretics Female hormones, such as estrogens or progestins, birth control pills Fenofibrate Gemfibrozil Isoniazid Lanreotide Female hormones or anabolic steroids MAOIs like Carbex, Eldepryl, Marplan, Nardil, and Parnate Medications for weight loss Medications for allergies, asthma, cold, or cough Medications for depression,  anxiety, or psychotic disturbances Niacin Nicotine NSAIDs, medications for pain and inflammation, like ibuprofen or naproxen Octreotide Pasireotide Pentamidine Phenytoin Probenecid Quinolone antibiotics such as ciprofloxacin, levofloxacin, ofloxacin Some  herbal dietary supplements Steroid medications such as prednisone or cortisone Sulfamethoxazole; trimethoprim Thyroid hormones Some medications can hide the warning symptoms of low blood sugar (hypoglycemia). You may need to monitor your blood sugar more closely if you are taking one of these medications. These include: Beta-blockers, often used for high blood pressure or heart problems (examples include atenolol, metoprolol, propranolol) Clonidine Guanethidine Reserpine This list may not describe all possible interactions. Give your health care provider a list of all the medicines, herbs, non-prescription drugs, or dietary supplements you use. Also tell them if you smoke, drink alcohol, or use illegal drugs. Some items may interact with your medicine. What should I watch for while using this medication? Visit your care team for regular checks on your progress. Drink plenty of fluids while taking this medication. Check with your care team if you get an attack of severe diarrhea, nausea, and vomiting. The loss of too much body fluid can make it dangerous for you to take this medication. A test called the HbA1C (A1C) will be monitored. This is a simple blood test. It measures your blood sugar control over the last 2 to 3 months. You will receive this test every 3 to 6 months. Learn how to check your blood sugar. Learn the symptoms of low and high blood sugar and how to manage them. Always carry a quick-source of sugar with you in case you have symptoms of low blood sugar. Examples include hard sugar candy or glucose tablets. Make sure others know that you can choke if you eat or drink when you develop serious symptoms of low blood sugar, such as seizures or unconsciousness. They must get medical help at once. Tell your care team if you have high blood sugar. You might need to change the dose of your medication. If you are sick or exercising more than usual, you might need to change the dose of your  medication. Do not skip meals. Ask your care team if you should avoid alcohol. Many nonprescription cough and cold products contain sugar or alcohol. These can affect blood sugar. Pens should never be shared. Even if the needle is changed, sharing may result in passing of viruses like hepatitis or HIV. Wear a medical ID bracelet or chain, and carry a card that describes your disease and details of your medication and dosage times. Do not become pregnant while taking this medication. Women should inform their care team if they wish to become pregnant or think they might be pregnant. There is a potential for serious side effects to an unborn child. Talk to your care team for more information. What side effects may I notice from receiving this medication? Side effects that you should report to your care team as soon as possible: Allergic reactions--skin rash, itching, hives, swelling of the face, lips, tongue, or throat Change in vision Dehydration--increased thirst, dry mouth, feeling faint or lightheaded, headache, dark yellow or brown urine Gallbladder problems--severe stomach pain, nausea, vomiting, fever Heart palpitations--rapid, pounding, or irregular heartbeat Kidney injury--decrease in the amount of urine, swelling of the ankles, hands, or feet Pancreatitis--severe stomach pain that spreads to your back or gets worse after eating or when touched, fever, nausea, vomiting Thyroid cancer--new mass or lump in the neck, pain or trouble swallowing, trouble breathing, hoarseness Side effects that usually do not require medical  attention (report to your care team if they continue or are bothersome): Diarrhea Loss of appetite Nausea Stomach pain Vomiting This list may not describe all possible side effects. Call your doctor for medical advice about side effects. You may report side effects to FDA at 1-800-FDA-1088. Where should I keep my medication? Keep out of the reach of children. Store  unopened pens in a refrigerator between 2 and 8 degrees C (36 and 46 degrees F). Do not freeze. Protect from light and heat. After you first use the pen, it can be stored for 56 days at room temperature between 15 and 30 degrees C (59 and 86 degrees F) or in a refrigerator. Throw away your used pen after 56 days or after the expiration date, whichever comes first. Do not store your pen with the needle attached. If the needle is left on, medication may leak from the pen. NOTE: This sheet is a summary. It may not cover all possible information. If you have questions about this medicine, talk to your doctor, pharmacist, or health care provider.  2023 Elsevier/Gold Standard (2021-02-20 00:00:00)  

## 2023-01-19 LAB — CBC WITH DIFFERENTIAL/PLATELET
Basophils Absolute: 0.1 10*3/uL (ref 0.0–0.2)
Basos: 1 %
EOS (ABSOLUTE): 0.3 10*3/uL (ref 0.0–0.4)
Eos: 3 %
Hematocrit: 39.8 % (ref 34.0–46.6)
Hemoglobin: 13.1 g/dL (ref 11.1–15.9)
Immature Grans (Abs): 0.1 10*3/uL (ref 0.0–0.1)
Immature Granulocytes: 1 %
Lymphocytes Absolute: 2.7 10*3/uL (ref 0.7–3.1)
Lymphs: 26 %
MCH: 28.2 pg (ref 26.6–33.0)
MCHC: 32.9 g/dL (ref 31.5–35.7)
MCV: 86 fL (ref 79–97)
Monocytes Absolute: 0.6 10*3/uL (ref 0.1–0.9)
Monocytes: 6 %
Neutrophils Absolute: 6.6 10*3/uL (ref 1.4–7.0)
Neutrophils: 63 %
Platelets: 501 10*3/uL — ABNORMAL HIGH (ref 150–450)
RBC: 4.65 x10E6/uL (ref 3.77–5.28)
RDW: 13.2 % (ref 11.7–15.4)
WBC: 10.2 10*3/uL (ref 3.4–10.8)

## 2023-01-19 LAB — CMP14+EGFR
ALT: 19 IU/L (ref 0–32)
AST: 20 IU/L (ref 0–40)
Albumin/Globulin Ratio: 2 (ref 1.2–2.2)
Albumin: 4.6 g/dL (ref 3.8–4.8)
Alkaline Phosphatase: 86 IU/L (ref 44–121)
BUN/Creatinine Ratio: 20 (ref 12–28)
BUN: 18 mg/dL (ref 8–27)
Bilirubin Total: 0.2 mg/dL (ref 0.0–1.2)
CO2: 24 mmol/L (ref 20–29)
Calcium: 10 mg/dL (ref 8.7–10.3)
Chloride: 97 mmol/L (ref 96–106)
Creatinine, Ser: 0.89 mg/dL (ref 0.57–1.00)
Globulin, Total: 2.3 g/dL (ref 1.5–4.5)
Glucose: 73 mg/dL (ref 70–99)
Potassium: 4.4 mmol/L (ref 3.5–5.2)
Sodium: 138 mmol/L (ref 134–144)
Total Protein: 6.9 g/dL (ref 6.0–8.5)
eGFR: 69 mL/min/{1.73_m2} (ref >59)

## 2023-01-19 LAB — LIPID PANEL
Chol/HDL Ratio: 3.6 ratio (ref 0.0–4.4)
Cholesterol, Total: 200 mg/dL — ABNORMAL HIGH (ref 100–199)
HDL: 55 mg/dL (ref >39)
LDL Chol Calc (NIH): 106 mg/dL — ABNORMAL HIGH (ref 0–99)
Triglycerides: 228 mg/dL — ABNORMAL HIGH (ref 0–149)
VLDL Cholesterol Cal: 39 mg/dL (ref 5–40)

## 2023-01-25 ENCOUNTER — Ambulatory Visit (INDEPENDENT_AMBULATORY_CARE_PROVIDER_SITE_OTHER): Payer: BC Managed Care – PPO

## 2023-01-25 DIAGNOSIS — E538 Deficiency of other specified B group vitamins: Secondary | ICD-10-CM

## 2023-01-25 NOTE — Progress Notes (Signed)
Cyanocobalamin injection given to left deltoid.  Patient tolerated well. 

## 2023-01-26 NOTE — Progress Notes (Unsigned)
Subjective:   ANNALISE MCDIARMID is a 72 y.o. female who presents for Medicare Annual (Subsequent) preventive examination.  Review of Systems    ***       Objective:    There were no vitals filed for this visit. There is no height or weight on file to calculate BMI.     11/17/2021   11:25 AM  Advanced Directives  Does Patient Have a Medical Advance Directive? No  Would patient like information on creating a medical advance directive? No - Patient declined    Current Medications (verified) Outpatient Encounter Medications as of 01/27/2023  Medication Sig   atorvastatin (LIPITOR) 40 MG tablet Take 1 tablet (40 mg total) by mouth daily.   Blood Glucose Monitoring Suppl (CONTOUR NEXT MONITOR) w/Device KIT USE AS DIRECTED   fenofibrate 160 MG tablet Take 1 tablet (160 mg total) by mouth daily.   glucose blood (ACCU-CHEK GUIDE) test strip Check BS up to 4 times daily Dx E11.9   hydrochlorothiazide (HYDRODIURIL) 25 MG tablet Take 1 tablet (25 mg total) by mouth daily.   LANCETS MICRO THIN 33G MISC Use to check BG once daily.  Dispense whichever goes with meter and that insurance will cover.   lisinopril (ZESTRIL) 40 MG tablet Take 1 tablet (40 mg total) by mouth daily.   metFORMIN (GLUCOPHAGE) 1000 MG tablet Take 1 tablet (1,000 mg total) by mouth 2 (two) times daily with a meal.   Semaglutide,0.25 or 0.5MG /DOS, (OZEMPIC, 0.25 OR 0.5 MG/DOSE,) 2 MG/3ML SOPN 0.25mg  weekly for 2 weeks then 0.5mg  weekly   Facility-Administered Encounter Medications as of 01/27/2023  Medication   cyanocobalamin ((VITAMIN B-12)) injection 1,000 mcg    Allergies (verified) Patient has no known allergies.   History: Past Medical History:  Diagnosis Date   Diabetes mellitus without complication (HCC)    Past Surgical History:  Procedure Laterality Date   ABDOMINAL HYSTERECTOMY     CATARACT EXTRACTION, BILATERAL     TONSILLECTOMY AND ADENOIDECTOMY     Family History  Problem Relation Age of Onset    COPD Father    Social History   Socioeconomic History   Marital status: Widowed    Spouse name: Not on file   Number of children: 2   Years of education: Not on file   Highest education level: Not on file  Occupational History   Not on file  Tobacco Use   Smoking status: Never   Smokeless tobacco: Never  Substance and Sexual Activity   Alcohol use: No    Alcohol/week: 0.0 standard drinks of alcohol   Drug use: No   Sexual activity: Not on file  Other Topics Concern   Not on file  Social History Narrative   2 daughters   5 grandchildren   Social Determinants of Health   Financial Resource Strain: Low Risk  (11/17/2021)   Overall Financial Resource Strain (CARDIA)    Difficulty of Paying Living Expenses: Not hard at all  Food Insecurity: Unknown (11/17/2021)   Hunger Vital Sign    Worried About Running Out of Food in the Last Year: Not on file    Ran Out of Food in the Last Year: Never true  Transportation Needs: No Transportation Needs (11/17/2021)   PRAPARE - Administrator, Civil Service (Medical): No    Lack of Transportation (Non-Medical): No  Physical Activity: Insufficiently Active (11/17/2021)   Exercise Vital Sign    Days of Exercise per Week: 3 days  Minutes of Exercise per Session: 20 min  Stress: No Stress Concern Present (11/17/2021)   [place]    Feeling of Stress : Not at all  Social Connections: Socially Isolated (11/17/2021)   Social Connection and Isolation Panel [NHANES]    Frequency of Communication with Friends and Family: More than three times a week    Frequency of Social Gatherings with Friends and Family: More than three times a week    Attends Religious Services: Never    Marine scientist or Organizations: No    Attends Archivist Meetings: Never    Marital Status: Widowed    Tobacco Counseling Counseling given: Not Answered   Clinical  Intake:                 Diabetic?Yes Nutrition Risk Assessment:  Has the patient had any N/V/D within the last 2 months?  {YES/NO:21197} Does the patient have any non-healing wounds?  {YES/NO:21197} Has the patient had any unintentional weight loss or weight gain?  {YES/NO:21197}  Diabetes:  Is the patient diabetic?  {YES/NO:21197} If diabetic, was a CBG obtained today?  {YES/NO:21197} Did the patient bring in their glucometer from home?  {YES/NO:21197} How often do you monitor your CBG's? ***.   Financial Strains and Diabetes Management:  Are you having any financial strains with the device, your supplies or your medication? {YES/NO:21197}.  Does the patient want to be seen by Chronic Care Management for management of their diabetes?  {YES/NO:21197} Would the patient like to be referred to a Nutritionist or for Diabetic Management?  {YES/NO:21197}  Diabetic Exams:  Diabetic Eye Exam: Completed *** Diabetic Foot Exam: Completed 07/17/22          Activities of Daily Living     No data to display          Patient Care Team: Chevis Pretty, FNP as PCP - General (Nurse Practitioner)  Indicate any recent Medical Services you may have received from other than [facility] providers in the past year (date may be approximate).     Assessment:   This is a routine wellness examination for [facility].  Hearing/Vision screen No results found.  Dietary issues and exercise activities discussed:     Goals Addressed   None    Depression Screen    01/18/2023    2:33 PM 07/17/2022    2:48 PM 03/23/2022    2:31 PM 01/20/2022    2:19 PM 11/17/2021   11:22 AM 07/17/2021    2:42 PM 04/10/2021    2:13 PM  PHQ 2/9 Scores  PHQ - 2 Score 0 0 0 0 0 0 0  PHQ- 9 Score 2 0 1 0  0     Fall Risk    01/18/2023    2:32 PM 07/17/2022    2:48 PM 01/20/2022    2:19 PM 11/17/2021   11:26 AM 07/17/2021    2:42 PM  Fall Risk   Falls in the past year? 0 0 0 0 0  Number falls in past  yr: 0   0   Injury with Fall? 0   0   Risk for fall due to : No Fall Risks   No Fall Risks   Follow up Falls evaluation completed   Falls prevention discussed     FALL RISK PREVENTION PERTAINING TO THE HOME:  Any stairs in or around the home? {YES/NO:21197} If so, are there any without handrails? {YES/NO:21197} Home  free of loose throw rugs in walkways, pet beds, electrical cords, etc? {YES/NO:21197} Adequate lighting in your home to reduce risk of falls? {YES/NO:21197}  ASSISTIVE DEVICES UTILIZED TO PREVENT FALLS:  Life alert? {YES/NO:21197} Use of a cane, walker or w/c? {YES/NO:21197} Grab bars in the bathroom? {YES/NO:21197} Shower chair or bench in shower? {YES/NO:21197} Elevated toilet seat or a handicapped toilet? {YES/NO:21197}  TIMED UP AND GO:  Was the test performed? No . Telephonic visit   Cognitive Function:        11/17/2021   11:28 AM  6CIT Screen  What Year? 0 points  What month? 0 points  What time? 0 points  Count back from 20 0 points  Months in reverse 0 points  Repeat phrase 0 points  Total Score 0 points    Immunizations Immunization History  Administered Date(s) Administered   Fluad Quad(high Dose 65+) 09/07/2019, 09/05/2020, 01/20/2022, 09/18/2022   Influenza Split 10/11/2013   Influenza, High Dose Seasonal PF 10/17/2018   Influenza,inj,Quad PF,6+ Mos 10/04/2014   Moderna SARS-COV2 Booster Vaccination 11/20/2020   Moderna Sars-Covid-2 Vaccination 02/15/2020, 03/15/2020   Pneumococcal Conjugate-13 06/09/2018   Pneumococcal Polysaccharide-23 09/07/2019   Tdap 01/28/2016   Zoster Recombinat (Shingrix) 07/17/2021, 01/20/2022   Zoster, Live 02/07/2015    TDAP status: Up to date  Flu Vaccine status: Up to date  Pneumococcal vaccine status: Up to date  {Covid-19 vaccine status:2101808}  Qualifies for Shingles Vaccine? Yes   Zostavax completed Yes   Shingrix Completed?: Yes  Screening Tests Health Maintenance  Topic Date Due    COVID-19 Vaccine (4 - 2023-24 season) 08/21/2022   OPHTHALMOLOGY EXAM  10/14/2022   Medicare Annual Wellness (AWV)  11/17/2022   Diabetic kidney evaluation - Urine ACR  01/20/2023   MAMMOGRAM  07/18/2023 (Originally 11/19/2016)   DEXA SCAN  07/18/2023 (Originally 01/28/2022)   Fecal DNA (Cologuard)  07/18/2023 (Originally 09/08/1996)   FOOT EXAM  07/18/2023   HEMOGLOBIN A1C  07/19/2023   Diabetic kidney evaluation - eGFR measurement  01/19/2024   DTaP/Tdap/Td (2 - Td or Tdap) 01/27/2026   Pneumonia Vaccine 91+ Years old  Completed   INFLUENZA VACCINE  Completed   Hepatitis C Screening  Completed   Zoster Vaccines- Shingrix  Completed   HPV VACCINES  Aged Out    Health Maintenance  Health Maintenance Due  Topic Date Due   COVID-19 Vaccine (4 - 2023-24 season) 08/21/2022   OPHTHALMOLOGY EXAM  10/14/2022   Medicare Annual Wellness (AWV)  11/17/2022   Diabetic kidney evaluation - Urine ACR  01/20/2023    {Colorectal cancer screening:2101809}  {Mammogram status:21018020}  {Bone Density status:21018021}  Lung Cancer Screening: (Low Dose CT Chest recommended if Age 52-80 years, 30 pack-year currently smoking OR have quit w/in 15years.) {DOES NOT does:27190::"does not"} qualify.   Lung Cancer Screening Referral: ***  Additional Screening:  Hepatitis C Screening: does qualify; Completed 01/28/16  Vision Screening: Recommended annual ophthalmology exams for early detection of glaucoma and other disorders of the eye. Is the patient up to date with their annual eye exam?  {YES/NO:21197} Who is the provider or what is the name of the office in which the patient attends annual eye exams? *** If pt is not established with a provider, would they like to be referred to a provider to establish care? {YES/NO:21197}.   Dental Screening: Recommended annual dental exams for proper oral hygiene  Community Resource Referral / Chronic Care Management: CRR required this visit?   {YES/NO:21197}  CCM required this visit?  {  YES/NO:21197}     Plan:     I have personally reviewed and noted the following in the patient's chart:   Medical and social history Use of alcohol, tobacco or illicit drugs  Current medications and supplements including opioid prescriptions. {Opioid [phone]} Functional ability and status Nutritional status Physical activity Advanced directives List of other physicians Hospitalizations, surgeries, and ER visits in previous 12 months Vitals Screenings to include cognitive, depression, and falls Referrals and appointments  In addition, I have reviewed and discussed with patient certain preventive protocols, quality metrics, and best practice recommendations. A written personalized care plan for preventive services as well as general preventive health recommendations were provided to patient.     Denman George [place], [place]   05/21/6072   Nurse Notes: ***

## 2023-01-26 NOTE — Patient Instructions (Signed)
Emma Nunez , Thank you for taking time to come for your Medicare Wellness Visit. I appreciate your ongoing commitment to your health goals. Please review the following plan we discussed and let me know if I can assist you in the future.   These are the goals we discussed:  Goals      DIET - REDUCE CALORIE INTAKE     Pt would like to lose weight, exercise more and lose weight.         This is a list of the screening recommended for you and due dates:  Health Maintenance  Topic Date Due   COVID-19 Vaccine (4 - 2023-24 season) 08/21/2022   Eye exam for diabetics  10/14/2022   Yearly kidney health urinalysis for diabetes  01/20/2023   Mammogram  07/18/2023*   DEXA scan (bone density measurement)  07/18/2023*   Cologuard (Stool DNA test)  07/18/2023*   Complete foot exam   07/18/2023   Hemoglobin A1C  07/19/2023   Yearly kidney function blood test for diabetes  01/19/2024   Medicare Annual Wellness Visit  01/28/2024   DTaP/Tdap/Td vaccine (2 - Td or Tdap) 01/27/2026   Pneumonia Vaccine  Completed   Flu Shot  Completed   Hepatitis C Screening: USPSTF Recommendation to screen - Ages 64-79 yo.  Completed   Zoster (Shingles) Vaccine  Completed   HPV Vaccine  Aged Out  *Topic was postponed. The date shown is not the original due date.    Advanced directives: Forms are available if you choose in the future to pursue completion.  This is recommended in order to make sure that your health wishes are honored in the event that you are unable to verbalize them to the provider.    Conditions/risks identified: Aim for 30 minutes of exercise or brisk walking, 6-8 glasses of water, and 5 servings of fruits and vegetables each day.   Next appointment: Follow up in one year for your annual wellness visit    Preventive Care 65 Years and Older, Female Preventive care refers to lifestyle choices and visits with your health care provider that can promote health and wellness. What does preventive care  include? A yearly physical exam. This is also called an annual well check. Dental exams once or twice a year. Routine eye exams. Ask your health care provider how often you should have your eyes checked. Personal lifestyle choices, including: Daily care of your teeth and gums. Regular physical activity. Eating a healthy diet. Avoiding tobacco and drug use. Limiting alcohol use. Practicing safe sex. Taking low-dose aspirin every day. Taking vitamin and mineral supplements as recommended by your health care provider. What happens during an annual well check? The services and screenings done by your health care provider during your annual well check will depend on your age, overall health, lifestyle risk factors, and family history of disease. Counseling  Your health care provider may ask you questions about your: Alcohol use. Tobacco use. Drug use. Emotional well-being. Home and relationship well-being. Sexual activity. Eating habits. History of falls. Memory and ability to understand (cognition). Work and work Statistician. Reproductive health. Screening  You may have the following tests or measurements: Height, weight, and BMI. Blood pressure. Lipid and cholesterol levels. These may be checked every 5 years, or more frequently if you are over 3 years old. Skin check. Lung cancer screening. You may have this screening every year starting at age 39 if you have a 30-pack-year history of smoking and currently smoke or have  quit within the past 15 years. Fecal occult blood test (FOBT) of the stool. You may have this test every year starting at age 36. Flexible sigmoidoscopy or colonoscopy. You may have a sigmoidoscopy every 5 years or a colonoscopy every 10 years starting at age 95. Hepatitis C blood test. Hepatitis B blood test. Sexually transmitted disease (STD) testing. Diabetes screening. This is done by checking your blood sugar (glucose) after you have not eaten for a while  (fasting). You may have this done every 1-3 years. Bone density scan. This is done to screen for osteoporosis. You may have this done starting at age 37. Mammogram. This may be done every 1-2 years. Talk to your health care provider about how often you should have regular mammograms. Talk with your health care provider about your test results, treatment options, and if necessary, the need for more tests. Vaccines  Your health care provider may recommend certain vaccines, such as: Influenza vaccine. This is recommended every year. Tetanus, diphtheria, and acellular pertussis (Tdap, Td) vaccine. You may need a Td booster every 10 years. Zoster vaccine. You may need this after age 33. Pneumococcal 13-valent conjugate (PCV13) vaccine. One dose is recommended after age 64. Pneumococcal polysaccharide (PPSV23) vaccine. One dose is recommended after age 8. Talk to your health care provider about which screenings and vaccines you need and how often you need them. This information is not intended to replace advice given to you by your health care provider. Make sure you discuss any questions you have with your health care provider. Document Released: 01/03/2016 Document Revised: 08/26/2016 Document Reviewed: 10/08/2015 Elsevier Interactive Patient Education  2017 [employer] Prevention in the Home Falls can cause injuries. They can happen to people of all ages. There are many things you can do to make your home safe and to help prevent falls. What can I do on the outside of my home? Regularly fix the edges of walkways and driveways and fix any cracks. Remove anything that might make you trip as you walk through a door, such as a raised step or threshold. Trim any bushes or trees on the path to your home. Use bright outdoor lighting. Clear any walking paths of anything that might make someone trip, such as rocks or tools. Regularly check to see if handrails are loose or broken. Make sure that  both sides of any steps have handrails. Any raised decks and porches should have guardrails on the edges. Have any leaves, snow, or ice cleared regularly. Use sand or salt on walking paths during winter. Clean up any spills in your garage right away. This includes oil or grease spills. What can I do in the bathroom? Use night lights. Install grab bars by the toilet and in the tub and shower. Do not use towel bars as grab bars. Use non-skid mats or decals in the tub or shower. If you need to sit down in the shower, use a plastic, non-slip stool. Keep the floor dry. Clean up any water that spills on the floor as soon as it happens. Remove soap buildup in the tub or shower regularly. Attach bath mats securely with double-sided non-slip rug tape. Do not have throw rugs and other things on the floor that can make you trip. What can I do in the bedroom? Use night lights. Make sure that you have a light by your bed that is easy to reach. Do not use any sheets or blankets that are too big for your bed.  They should not hang down onto the floor. Have a firm chair that has side arms. You can use this for support while you get dressed. Do not have throw rugs and other things on the floor that can make you trip. What can I do in the kitchen? Clean up any spills right away. Avoid walking on wet floors. Keep items that you use a lot in easy-to-reach places. If you need to reach something above you, use a strong step stool that has a grab bar. Keep electrical cords out of the way. Do not use floor polish or wax that makes floors slippery. If you must use wax, use non-skid floor wax. Do not have throw rugs and other things on the floor that can make you trip. What can I do with my stairs? Do not leave any items on the stairs. Make sure that there are handrails on both sides of the stairs and use them. Fix handrails that are broken or loose. Make sure that handrails are as long as the stairways. Check  any carpeting to make sure that it is firmly attached to the stairs. Fix any carpet that is loose or worn. Avoid having throw rugs at the top or bottom of the stairs. If you do have throw rugs, attach them to the floor with carpet tape. Make sure that you have a light switch at the top of the stairs and the bottom of the stairs. If you do not have them, ask someone to add them for you. What else can I do to help prevent falls? Wear shoes that: Do not have high heels. Have rubber bottoms. Are comfortable and fit you well. Are closed at the toe. Do not wear sandals. If you use a stepladder: Make sure that it is fully opened. Do not climb a closed stepladder. Make sure that both sides of the stepladder are locked into place. Ask someone to hold it for you, if possible. Clearly mark and make sure that you can see: Any grab bars or handrails. First and last steps. Where the edge of each step is. Use tools that help you move around (mobility aids) if they are needed. These include: Canes. Walkers. Scooters. Crutches. Turn on the lights when you go into a dark area. Replace any light bulbs as soon as they burn out. Set up your furniture so you have a clear path. Avoid moving your furniture around. If any of your floors are uneven, fix them. If there are any pets around you, be aware of where they are. Review your medicines with your doctor. Some medicines can make you feel dizzy. This can increase your chance of falling. Ask your doctor what other things that you can do to help prevent falls. This information is not intended to replace advice given to you by your health care provider. Make sure you discuss any questions you have with your health care provider. Document Released: 10/03/2009 Document Revised: 05/14/2016 Document Reviewed: 01/11/2015 Elsevier Interactive Patient Education  2017 [employer].

## 2023-01-27 ENCOUNTER — Ambulatory Visit (INDEPENDENT_AMBULATORY_CARE_PROVIDER_SITE_OTHER): Payer: BC Managed Care – PPO

## 2023-01-27 VITALS — Ht 65.0 in | Wt 203.0 lb

## 2023-01-27 DIAGNOSIS — Z0000 Encounter for general adult medical examination without abnormal findings: Secondary | ICD-10-CM | POA: Diagnosis not present

## 2023-02-25 ENCOUNTER — Ambulatory Visit (INDEPENDENT_AMBULATORY_CARE_PROVIDER_SITE_OTHER): Payer: BC Managed Care – PPO

## 2023-02-25 DIAGNOSIS — E538 Deficiency of other specified B group vitamins: Secondary | ICD-10-CM

## 2023-02-25 NOTE — Progress Notes (Signed)
Cyanocobalamin injection given to right deltoid.  Patient tolerated well. 

## 2023-03-29 ENCOUNTER — Ambulatory Visit (INDEPENDENT_AMBULATORY_CARE_PROVIDER_SITE_OTHER): Payer: BC Managed Care – PPO

## 2023-03-29 DIAGNOSIS — E538 Deficiency of other specified B group vitamins: Secondary | ICD-10-CM

## 2023-03-29 NOTE — Progress Notes (Signed)
Cyanocobalamin injection given to left deltoid.  Patient tolerated well. 

## 2023-04-20 ENCOUNTER — Ambulatory Visit (INDEPENDENT_AMBULATORY_CARE_PROVIDER_SITE_OTHER): Payer: BC Managed Care – PPO | Admitting: Nurse Practitioner

## 2023-04-20 ENCOUNTER — Encounter: Payer: Self-pay | Admitting: Nurse Practitioner

## 2023-04-20 VITALS — BP 137/80 | HR 77 | Temp 96.9°F | Resp 20 | Ht 65.0 in | Wt 185.0 lb

## 2023-04-20 DIAGNOSIS — Z7984 Long term (current) use of oral hypoglycemic drugs: Secondary | ICD-10-CM | POA: Diagnosis not present

## 2023-04-20 DIAGNOSIS — I10 Essential (primary) hypertension: Secondary | ICD-10-CM

## 2023-04-20 DIAGNOSIS — E785 Hyperlipidemia, unspecified: Secondary | ICD-10-CM

## 2023-04-20 DIAGNOSIS — E119 Type 2 diabetes mellitus without complications: Secondary | ICD-10-CM | POA: Diagnosis not present

## 2023-04-20 DIAGNOSIS — D510 Vitamin B12 deficiency anemia due to intrinsic factor deficiency: Secondary | ICD-10-CM

## 2023-04-20 DIAGNOSIS — Z6830 Body mass index (BMI) 30.0-30.9, adult: Secondary | ICD-10-CM

## 2023-04-20 LAB — BAYER DCA HB A1C WAIVED: HB A1C (BAYER DCA - WAIVED): 6.2 % — ABNORMAL HIGH (ref 4.8–5.6)

## 2023-04-20 MED ORDER — HYDROCHLOROTHIAZIDE 25 MG PO TABS: 25.0000 mg | ORAL_TABLET | Freq: Every day | ORAL | 1 refills | Status: DC

## 2023-04-20 MED ORDER — LISINOPRIL 40 MG PO TABS: 40.0000 mg | ORAL_TABLET | Freq: Every day | ORAL | 1 refills | Status: DC

## 2023-04-20 MED ORDER — ATORVASTATIN CALCIUM 40 MG PO TABS: 40.0000 mg | ORAL_TABLET | Freq: Every day | ORAL | 1 refills | Status: DC

## 2023-04-20 MED ORDER — OZEMPIC (0.25 OR 0.5 MG/DOSE) 2 MG/3ML ~~LOC~~ SOPN
PEN_INJECTOR | SUBCUTANEOUS | 3 refills | Status: DC
Start: 2023-04-20 — End: 2023-09-06

## 2023-04-20 MED ORDER — METFORMIN HCL 1000 MG PO TABS: 1000.0000 mg | ORAL_TABLET | Freq: Two times a day (BID) | ORAL | 1 refills | Status: DC

## 2023-04-20 MED ORDER — FENOFIBRATE 160 MG PO TABS
160.0000 mg | ORAL_TABLET | Freq: Every day | ORAL | 1 refills | Status: DC
Start: 2023-04-20 — End: 2023-10-19

## 2023-04-20 NOTE — Progress Notes (Signed)
Subjective:    Patient ID: Emma Nunez, female    DOB: 1951-03-12, 72 y.o.   MRN: [id number]   Chief Complaint: medical management of chronic issues     HPI:  Emma Nunez is a 72 y.o. who identifies as a female who was assigned female at birth.   Social history: Lives with: husband Work history: retired   Water engineer in today for follow up of the following chronic medical issues:  1. Essential hypertension, benign No c/o chest pain, sob or headache. Does not check blood pressure at home. BP Readings from Last 3 Encounters:  01/18/23 (!) 142/77  07/17/22 137/77  03/23/22 131/77     2. Hyperlipidemia with target LDL less than 100 Does not watch diet and does no dedicated exercise. Lab Results  Component Value Date   CHOL 200 (H) 01/18/2023   HDL 55 01/18/2023   LDLCALC 106 (H) 01/18/2023   TRIG 228 (H) 01/18/2023   CHOLHDL 3.6 01/18/2023   The ASCVD Risk score (Arnett DK, et al., 2019) failed to calculate for the following reasons:   The systolic blood pressure is missing   3. Type 2 diabetes mellitus without complication, without long-term current use of insulin (HCC) Fasting blood sugars are running around 100-130. Lab Results  Component Value Date   HGBA1C 6.3 (H) 01/18/2023     4. Vitamin B12 deficiency anemia due to intrinsic factor deficiency Lab Results  Component Value Date   VITAMINB12 425 04/10/2021   5. Bmi 30.0-30.9 Weight is down a total of 20 lbs Wt Readings from Last 3 Encounters:  04/20/23 185 lb (83.9 kg)  01/27/23 203 lb (92.1 kg)  01/18/23 203 lb 6.4 oz (92.3 kg)   BMI Readings from Last 3 Encounters:  04/20/23 30.79 kg/m  01/27/23 33.78 kg/m  01/18/23 33.85 kg/m     New complaints: None today  No Known Allergies Outpatient Encounter Medications as of 04/20/2023  Medication Sig   atorvastatin (LIPITOR) 40 MG tablet Take 1 tablet (40 mg total) by mouth daily.   Blood Glucose Monitoring Suppl (CONTOUR NEXT MONITOR) w/Device KIT  USE AS DIRECTED   fenofibrate 160 MG tablet Take 1 tablet (160 mg total) by mouth daily.   glucose blood (ACCU-CHEK GUIDE) test strip Check BS up to 4 times daily Dx E11.9   hydrochlorothiazide (HYDRODIURIL) 25 MG tablet Take 1 tablet (25 mg total) by mouth daily.   LANCETS MICRO THIN 33G MISC Use to check BG once daily.  Dispense whichever goes with meter and that insurance will cover.   lisinopril (ZESTRIL) 40 MG tablet Take 1 tablet (40 mg total) by mouth daily.   metFORMIN (GLUCOPHAGE) 1000 MG tablet Take 1 tablet (1,000 mg total) by mouth 2 (two) times daily with a meal.   Semaglutide,0.25 or 0.5MG /DOS, (OZEMPIC, 0.25 OR 0.5 MG/DOSE,) 2 MG/3ML SOPN 0.25mg  weekly for 2 weeks then 0.5mg  weekly   Facility-Administered Encounter Medications as of 04/20/2023  Medication   cyanocobalamin ((VITAMIN B-12)) injection 1,000 mcg    Past Surgical History:  Procedure Laterality Date   ABDOMINAL HYSTERECTOMY     CATARACT EXTRACTION, BILATERAL     TONSILLECTOMY AND ADENOIDECTOMY      Family History  Problem Relation Age of Onset   COPD Father       Controlled substance contract: n/a     Review of Systems  Constitutional:  Negative for diaphoresis.  Eyes:  Negative for pain.  Respiratory:  Negative for shortness of breath.   Cardiovascular:  Negative for chest pain, palpitations and leg swelling.  Gastrointestinal:  Negative for abdominal pain.  Endocrine: Negative for polydipsia.  Skin:  Negative for rash.  Neurological:  Negative for dizziness, weakness and headaches.  Hematological:  Does not bruise/bleed easily.  All other systems reviewed and are negative.      Objective:   Physical Exam Vitals and nursing note reviewed.  Constitutional:      General: She is not in acute distress.    Appearance: Normal appearance. She is well-developed.  HENT:     Head: Normocephalic.     Right Ear: Tympanic membrane normal.     Left Ear: Tympanic membrane normal.     Nose: Nose  normal.     Mouth/Throat:     Mouth: Mucous membranes are moist.  Eyes:     Pupils: Pupils are equal, round, and reactive to light.  Neck:     Vascular: No carotid bruit or JVD.  Cardiovascular:     Rate and Rhythm: Normal rate and regular rhythm.     Heart sounds: Normal heart sounds.  Pulmonary:     Effort: Pulmonary effort is normal. No respiratory distress.     Breath sounds: Normal breath sounds. No wheezing or rales.  Chest:     Chest wall: No tenderness.  Abdominal:     General: Bowel sounds are normal. There is no distension or abdominal bruit.     Palpations: Abdomen is soft. There is no hepatomegaly, splenomegaly, mass or pulsatile mass.     Tenderness: There is no abdominal tenderness.  Musculoskeletal:        General: Normal range of motion.     Cervical back: Normal range of motion and neck supple.  Lymphadenopathy:     Cervical: No cervical adenopathy.  Skin:    General: Skin is warm and dry.  Neurological:     Mental Status: She is alert and oriented to person, place, and time.     Deep Tendon Reflexes: Reflexes are normal and symmetric.  Psychiatric:        Behavior: Behavior normal.        Thought Content: Thought content normal.        Judgment: Judgment normal.     Hgba1c 6.2%  BP 137/80   Pulse 77   Temp (!) 96.9 F (36.1 C) (Temporal)   Resp 20   Ht 5\' 5"  (1.651 m)   Wt 185 lb (83.9 kg)   SpO2 98%   BMI 30.79 kg/m      Assessment & Plan:  [name] comes in today with chief complaint of No chief complaint on file.   Diagnosis and orders addressed:  1. Essential hypertension, benign Low sodium diet - CBC with Differential/Platelet - CMP14+EGFR - hydrochlorothiazide (HYDRODIURIL) 25 MG tablet; Take 1 tablet (25 mg total) by mouth daily.  Dispense: 90 tablet; Refill: 1 - lisinopril (ZESTRIL) 40 MG tablet; Take 1 tablet (40 mg total) by mouth daily.  Dispense: 90 tablet; Refill: 1  2. Hyperlipidemia with target LDL less than 100 Low  fat diet - Lipid panel - atorvastatin (LIPITOR) 40 MG tablet; Take 1 tablet (40 mg total) by mouth daily.  Dispense: 90 tablet; Refill: 1 - fenofibrate 160 MG tablet; Take 1 tablet (160 mg total) by mouth daily.  Dispense: 90 tablet; Refill: 1  3. Type 2 diabetes mellitus without complication, without long-term current use of insulin (HCC) Continue  to watch carbs in diet - Bayer DCA Hb A1c Waived -  Microalbumin / creatinine urine ratio - metFORMIN (GLUCOPHAGE) 1000 MG tablet; Take 1 tablet (1,000 mg total) by mouth 2 (two) times daily with a meal.  Dispense: 180 tablet; Refill: 1 - Semaglutide,0.25 or 0.5MG /DOS, (OZEMPIC, 0.25 OR 0.5 MG/DOSE,) 2 MG/3ML SOPN; 0.25mg  weekly for 2 weeks then 0.5mg  weekly  Dispense: 3 mL; Refill: 3  4. Vitamin B12 deficiency anemia due to intrinsic factor deficiency Continue b12  5. Bmi 30.0-30.9 Discussed diet and exercise for person with BMI >25 Will recheck weight in 3-6 months   Labs pending Health Maintenance reviewed Diet and exercise encouraged  Follow up plan: 6 months   Emilee-Margaret Daphine Deutscher, FNP

## 2023-04-20 NOTE — Patient Instructions (Signed)

## 2023-04-21 LAB — CMP14+EGFR
ALT: 18 IU/L (ref 0–32)
AST: 24 IU/L (ref 0–40)
Albumin/Globulin Ratio: 1.9 (ref 1.2–2.2)
Albumin: 4.5 g/dL (ref 3.8–4.8)
Alkaline Phosphatase: 63 IU/L (ref 44–121)
BUN/Creatinine Ratio: 16 (ref 12–28)
BUN: 17 mg/dL (ref 8–27)
Bilirubin Total: 0.2 mg/dL (ref 0.0–1.2)
CO2: 21 mmol/L (ref 20–29)
Calcium: 10.1 mg/dL (ref 8.7–10.3)
Chloride: 96 mmol/L (ref 96–106)
Creatinine, Ser: 1.08 mg/dL — ABNORMAL HIGH (ref 0.57–1.00)
Globulin, Total: 2.4 g/dL (ref 1.5–4.5)
Glucose: 112 mg/dL — ABNORMAL HIGH (ref 70–99)
Potassium: 4 mmol/L (ref 3.5–5.2)
Sodium: 137 mmol/L (ref 134–144)
Total Protein: 6.9 g/dL (ref 6.0–8.5)
eGFR: 55 mL/min/{1.73_m2} — ABNORMAL LOW (ref >59)

## 2023-04-21 LAB — LIPID PANEL
Chol/HDL Ratio: 3.4 ratio (ref 0.0–4.4)
Cholesterol, Total: 178 mg/dL (ref 100–199)
HDL: 53 mg/dL (ref >39)
LDL Chol Calc (NIH): 96 mg/dL (ref 0–99)
Triglycerides: 167 mg/dL — ABNORMAL HIGH (ref 0–149)
VLDL Cholesterol Cal: 29 mg/dL (ref 5–40)

## 2023-04-21 LAB — CBC WITH DIFFERENTIAL/PLATELET
Basophils Absolute: 0.1 10*3/uL (ref 0.0–0.2)
Basos: 1 %
EOS (ABSOLUTE): 0.2 10*3/uL (ref 0.0–0.4)
Eos: 2 %
Hematocrit: 39.1 % (ref 34.0–46.6)
Hemoglobin: 13.1 g/dL (ref 11.1–15.9)
Immature Grans (Abs): 0 10*3/uL (ref 0.0–0.1)
Immature Granulocytes: 0 %
Lymphocytes Absolute: 2.9 10*3/uL (ref 0.7–3.1)
Lymphs: 28 %
MCH: 29 pg (ref 26.6–33.0)
MCHC: 33.5 g/dL (ref 31.5–35.7)
MCV: 87 fL (ref 79–97)
Monocytes Absolute: 0.5 10*3/uL (ref 0.1–0.9)
Monocytes: 5 %
Neutrophils Absolute: 6.5 10*3/uL (ref 1.4–7.0)
Neutrophils: 64 %
Platelets: 585 10*3/uL — ABNORMAL HIGH (ref 150–450)
RBC: 4.51 x10E6/uL (ref 3.77–5.28)
RDW: 13.1 % (ref 11.7–15.4)
WBC: 10.2 10*3/uL (ref 3.4–10.8)

## 2023-04-21 LAB — MICROALBUMIN / CREATININE URINE RATIO
Creatinine, Urine: 88 mg/dL
Microalb/Creat Ratio: 6 mg/g creat (ref 0–29)
Microalbumin, Urine: 5.6 ug/mL

## 2023-04-29 ENCOUNTER — Ambulatory Visit: Payer: BC Managed Care – PPO

## 2023-04-30 ENCOUNTER — Ambulatory Visit (INDEPENDENT_AMBULATORY_CARE_PROVIDER_SITE_OTHER): Payer: BC Managed Care – PPO

## 2023-04-30 DIAGNOSIS — E538 Deficiency of other specified B group vitamins: Secondary | ICD-10-CM

## 2023-04-30 NOTE — Progress Notes (Signed)
Cyanocobalamin injection given to right deltoid.  Patient tolerated well. 

## 2023-05-28 ENCOUNTER — Ambulatory Visit (INDEPENDENT_AMBULATORY_CARE_PROVIDER_SITE_OTHER): Payer: BC Managed Care – PPO

## 2023-05-28 DIAGNOSIS — E538 Deficiency of other specified B group vitamins: Secondary | ICD-10-CM

## 2023-05-28 NOTE — Progress Notes (Signed)
B12 injection - 1mL given lt deltoid - pt tolerated well

## 2023-06-28 ENCOUNTER — Ambulatory Visit (INDEPENDENT_AMBULATORY_CARE_PROVIDER_SITE_OTHER): Payer: BC Managed Care – PPO

## 2023-06-28 DIAGNOSIS — E538 Deficiency of other specified B group vitamins: Secondary | ICD-10-CM | POA: Diagnosis not present

## 2023-06-28 NOTE — Progress Notes (Signed)
Pt given cyanocobalamin in right deltoid. Pt tolerated well.

## 2023-07-06 ENCOUNTER — Encounter (INDEPENDENT_AMBULATORY_CARE_PROVIDER_SITE_OTHER): Payer: BC Managed Care – PPO | Admitting: Ophthalmology

## 2023-07-06 DIAGNOSIS — H43813 Vitreous degeneration, bilateral: Secondary | ICD-10-CM

## 2023-07-06 DIAGNOSIS — H353131 Nonexudative age-related macular degeneration, bilateral, early dry stage: Secondary | ICD-10-CM

## 2023-07-06 DIAGNOSIS — I10 Essential (primary) hypertension: Secondary | ICD-10-CM

## 2023-07-06 DIAGNOSIS — H35033 Hypertensive retinopathy, bilateral: Secondary | ICD-10-CM | POA: Diagnosis not present

## 2023-07-06 DIAGNOSIS — H35372 Puckering of macula, left eye: Secondary | ICD-10-CM | POA: Diagnosis not present

## 2023-07-07 ENCOUNTER — Encounter (INDEPENDENT_AMBULATORY_CARE_PROVIDER_SITE_OTHER): Payer: BC Managed Care – PPO | Admitting: Ophthalmology

## 2023-07-29 ENCOUNTER — Ambulatory Visit (INDEPENDENT_AMBULATORY_CARE_PROVIDER_SITE_OTHER): Payer: BC Managed Care – PPO

## 2023-07-29 DIAGNOSIS — E538 Deficiency of other specified B group vitamins: Secondary | ICD-10-CM

## 2023-07-29 NOTE — Progress Notes (Signed)
Cyanocobalamin injection given to left deltoid.  Patient tolerated well. 

## 2023-08-30 ENCOUNTER — Ambulatory Visit (INDEPENDENT_AMBULATORY_CARE_PROVIDER_SITE_OTHER): Payer: BC Managed Care – PPO

## 2023-08-30 DIAGNOSIS — E538 Deficiency of other specified B group vitamins: Secondary | ICD-10-CM | POA: Diagnosis not present

## 2023-08-30 NOTE — Progress Notes (Signed)
Cyanocobalamin injection given to right deltoid.  Patient tolerated well. 

## 2023-09-06 ENCOUNTER — Other Ambulatory Visit: Payer: Self-pay | Admitting: Nurse Practitioner

## 2023-09-06 DIAGNOSIS — E119 Type 2 diabetes mellitus without complications: Secondary | ICD-10-CM

## 2023-09-24 ENCOUNTER — Other Ambulatory Visit: Payer: Self-pay | Admitting: Nurse Practitioner

## 2023-09-24 DIAGNOSIS — Z1212 Encounter for screening for malignant neoplasm of rectum: Secondary | ICD-10-CM

## 2023-09-24 DIAGNOSIS — Z1211 Encounter for screening for malignant neoplasm of colon: Secondary | ICD-10-CM

## 2023-09-30 ENCOUNTER — Ambulatory Visit (INDEPENDENT_AMBULATORY_CARE_PROVIDER_SITE_OTHER): Payer: BC Managed Care – PPO

## 2023-09-30 DIAGNOSIS — E538 Deficiency of other specified B group vitamins: Secondary | ICD-10-CM

## 2023-09-30 NOTE — Progress Notes (Signed)
Cyanocobalamin injection given to left deltoid.  Patient tolerated well. 

## 2023-10-19 ENCOUNTER — Ambulatory Visit (INDEPENDENT_AMBULATORY_CARE_PROVIDER_SITE_OTHER): Payer: BC Managed Care – PPO | Admitting: Nurse Practitioner

## 2023-10-19 ENCOUNTER — Encounter: Payer: Self-pay | Admitting: Nurse Practitioner

## 2023-10-19 VITALS — BP 136/79 | HR 90 | Temp 97.5°F | Resp 20 | Ht 65.0 in | Wt 170.0 lb

## 2023-10-19 DIAGNOSIS — D510 Vitamin B12 deficiency anemia due to intrinsic factor deficiency: Secondary | ICD-10-CM | POA: Diagnosis not present

## 2023-10-19 DIAGNOSIS — Z7984 Long term (current) use of oral hypoglycemic drugs: Secondary | ICD-10-CM | POA: Diagnosis not present

## 2023-10-19 DIAGNOSIS — E785 Hyperlipidemia, unspecified: Secondary | ICD-10-CM

## 2023-10-19 DIAGNOSIS — I10 Essential (primary) hypertension: Secondary | ICD-10-CM

## 2023-10-19 DIAGNOSIS — Z7985 Long-term (current) use of injectable non-insulin antidiabetic drugs: Secondary | ICD-10-CM

## 2023-10-19 DIAGNOSIS — E119 Type 2 diabetes mellitus without complications: Secondary | ICD-10-CM | POA: Diagnosis not present

## 2023-10-19 DIAGNOSIS — Z6837 Body mass index (BMI) 37.0-37.9, adult: Secondary | ICD-10-CM

## 2023-10-19 LAB — BAYER DCA HB A1C WAIVED: HB A1C (BAYER DCA - WAIVED): 5.3 % (ref 4.8–5.6)

## 2023-10-19 MED ORDER — SEMAGLUTIDE (1 MG/DOSE) 4 MG/3ML ~~LOC~~ SOPN: 1.0000 mg | PEN_INJECTOR | SUBCUTANEOUS | 2 refills | Status: DC

## 2023-10-19 MED ORDER — FENOFIBRATE 160 MG PO TABS: 160.0000 mg | ORAL_TABLET | Freq: Every day | ORAL | 1 refills | Status: DC

## 2023-10-19 MED ORDER — LISINOPRIL 40 MG PO TABS: 40.0000 mg | ORAL_TABLET | Freq: Every day | ORAL | 1 refills | Status: DC

## 2023-10-19 MED ORDER — HYDROCHLOROTHIAZIDE 25 MG PO TABS: 25.0000 mg | ORAL_TABLET | Freq: Every day | ORAL | 1 refills | Status: DC

## 2023-10-19 MED ORDER — METFORMIN HCL 1000 MG PO TABS: 1000.0000 mg | ORAL_TABLET | Freq: Two times a day (BID) | ORAL | 1 refills | Status: DC

## 2023-10-19 MED ORDER — ATORVASTATIN CALCIUM 40 MG PO TABS: 40.0000 mg | ORAL_TABLET | Freq: Every day | ORAL | 1 refills | Status: DC

## 2023-10-19 NOTE — Patient Instructions (Signed)
Exercise Information for Aging Adults Staying physically active is important as you age. Physical activity and exercise can help in maintaining quality of life, health, physical function, and reducing falls. The four types of exercises that are best for older adults are endurance, strength, balance, and flexibility. Contact your health care provider before you start any exercise routine. Ask your health care provider what activities are safe for you. What are the risks? Risks associated with exercising include: Overdoing it. This may lead to sore muscles or fatigue. Falls. Injuries. Dehydration. How to do these exercises Endurance exercises Endurance (aerobic) exercises raise your breathing rate and heart rate. Increasing your endurance helps you do everyday tasks and stay healthy. By improving the health of your body system that includes your heart, lungs, and blood vessels (circulatory system), you may also delay or prevent diseases such as heart disease, diabetes, and weak bones (osteoporosis). Types of endurance exercises include: Sports. Indoor activities, such as using gym equipment, doing water aerobics, or dancing. Outdoor activities, such as biking or jogging. Tasks around the house, such as gardening, yard work, and heavy household chores like cleaning. Walking, such as hiking or walking around your neighborhood. When doing endurance exercises, make sure you: Are aware of your surroundings. Use safety equipment as directed. Dress in layers when exercising outdoors. Drink plenty of water to stay well hydrated. Build up endurance slowly. Start with 10 minutes at a time, and gradually build up to doing 30 minutes at a time. Unless your health care provider gave you different instructions, aim to exercise for a total of 150 minutes a week. Spread out that time so you are working on endurance 3 or more days a week. Strength exercises Lifting, pulling, or pushing weights helps to  strengthen muscles. Having stronger muscles makes it easier to do everyday activities, such as getting up from a chair, climbing stairs, carrying groceries, and playing with grandchildren. Strength exercises include arm and leg exercises that may be done: With weights. Without weights (using your own body weight). With a resistance band. When doing strength exercises: Move smoothly and steadily. Do not suddenly thrust or jerk the weights, the resistance band, or your body. Start with no weights or with light weights, and gradually add more weight over time. Eventually, aim to use weights that are hard or very hard for you to lift. This means that you are able to do 8 repetitions with the weight, and the last few repetitions are very challenging. Lift or push weights into position for 3 seconds, hold the position for 1 second, and then take 3 seconds to return to your starting position. Breathe out (exhale) during difficult movements, like lifting or pushing weights. Breathe in (inhale) to relax your muscles before the next repetition. Consider alternating arms or legs, especially when you first start strength exercises. Expect some slight muscle soreness after each session. Do strength exercises on 2 or more days a week, for 30 minutes at a time. Avoid exercising the same muscle groups two days in a row. For example, if you work on your leg muscles one day, work on your arm muscles the next day. When you can do two sets of 10-15 repetitions with a certain weight, increase the amount of weight. Balance exercises Balance exercises can help to prevent falls. Balance exercises include: Standing on one foot. Heel-to-toe walk. Balance walk. Tai chi. Make sure you have something sturdy to hold onto while doing balance exercises, such as a sturdy chair. As your balance   improves, challenge yourself by holding on to the chair with one hand instead of two, and then with no hands. Trying exercises with your  eyes closed also challenges your balance, but be sure to have a sturdy surface (like a countertop) close by in case you need it. Do balance exercises as often as you want, or as often as directed by your health care provider. Flexibility exercises  Flexibility exercises improve how far you can bend, straighten, move, or rotate parts of your body (range of motion). These exercises also help you do everyday activities such as getting dressed or reaching for objects. Flexibility exercises include stretching different parts of the body, and they may be done in a standing or seated position or on the floor. When stretching, make sure you: Keep a slight bend in your arms and legs. Avoid completely straightening ("locking") your joints. Do not stretch so far that you feel pain. You should feel a mild stretching feeling. You may try stretching farther as you become more flexible over time. Relax and breathe between stretches. Hold on to something sturdy for balance as needed. Hold each stretch for 10-30 seconds. Repeat each stretch 3-5 times. General safety tips Exercise in well-lit areas. Do not hold your breath during exercises or stretches. Warm up before exercising, and cool down after exercising. This can help prevent injury. Drink plenty of water during exercise or any activity that makes you sweat. If you are not sure if an exercise is safe for you, or you are not sure how to do an exercise, talk with your health care provider. This is especially important if you have had surgery on muscles, bones, or joints (orthopedic surgery). Where to find more information You can find more information about exercise for older adults from: Your local health department, fitness center, or community center. These facilities may have programs for aging adults. National Institute on Aging: www.nia.nih.gov National Council on Aging: www.ncoa.org Summary Staying physically active is important as you age. Doing  endurance, strength, balance, and flexibility exercises can help in maintaining quality of life, health, physical function, and reducing falls. Make sure to contact your health care provider before you start any exercise routine. Ask your health care provider what activities are safe for you. This information is not intended to replace advice given to you by your health care provider. Make sure you discuss any questions you have with your health care provider. Document Revised: 04/21/2021 Document Reviewed: 04/21/2021 Elsevier Patient Education  2024 [employer]  

## 2023-10-19 NOTE — Progress Notes (Signed)
Subjective:    Patient ID: Emma Nunez, female    DOB: Nov 10, 1951, 72 y.o.   MRN: [id number]   Chief Complaint: medical management of chronic issues     HPI:  Emma Nunez is a 72 y.o. who identifies as a female who was assigned female at birth.   Social history: Lives with: husband Work history: retired   Water engineer in today for follow up of the following chronic medical issues:  1. Essential hypertension, benign No c/o chest pain, sob or headache. Doe snot check blood pressure at home. BP Readings from Last 3 Encounters:  04/20/23 137/80  01/18/23 (!) 142/77  07/17/22 137/77     2. Diabetes mellitus treated with oral medication (HCC) 3. Diabetes mellitus treated with injections of non-insulin medication (HCC) Fasting blood sugars are running around 100-120. Really does not check it very often Lab Results  Component Value Date   HGBA1C 6.2 (H) 04/20/2023     4. Hyperlipidemia with target LDL less than 100 Does try to watch diet but does no dedicated exercise. Lab Results  Component Value Date   CHOL 178 04/20/2023   HDL 53 04/20/2023   LDLCALC 96 04/20/2023   TRIG 167 (H) 04/20/2023   CHOLHDL 3.4 04/20/2023     5. Vitamin B12 deficiency anemia due to intrinsic factor deficiency Gets monthly b12 injections  6. BMI 37.0-37.9, adult Weight is down 15lbs Wt Readings from Last 3 Encounters:  10/19/23 170 lb (77.1 kg)  04/20/23 185 lb (83.9 kg)  01/27/23 203 lb (92.1 kg)   BMI Readings from Last 3 Encounters:  10/19/23 28.29 kg/m  04/20/23 30.79 kg/m  01/27/23 33.78 kg/m      New complaints: None today  No Known Allergies Outpatient Encounter Medications as of 10/19/2023  Medication Sig   atorvastatin (LIPITOR) 40 MG tablet Take 1 tablet (40 mg total) by mouth daily.   Blood Glucose Monitoring Suppl (CONTOUR NEXT MONITOR) w/Device KIT USE AS DIRECTED   fenofibrate 160 MG tablet Take 1 tablet (160 mg total) by mouth daily.   glucose blood  (ACCU-CHEK GUIDE) test strip Check BS up to 4 times daily Dx E11.9   hydrochlorothiazide (HYDRODIURIL) 25 MG tablet Take 1 tablet (25 mg total) by mouth daily.   LANCETS MICRO THIN 33G MISC Use to check BG once daily.  Dispense whichever goes with meter and that insurance will cover.   lisinopril (ZESTRIL) 40 MG tablet Take 1 tablet (40 mg total) by mouth daily.   metFORMIN (GLUCOPHAGE) 1000 MG tablet Take 1 tablet (1,000 mg total) by mouth 2 (two) times daily with a meal.   Semaglutide,0.25 or 0.5MG /DOS, (OZEMPIC, 0.25 OR 0.5 MG/DOSE,) 2 MG/3ML SOPN INJECT 0.5MG  ONCE WEEKLY   Facility-Administered Encounter Medications as of 10/19/2023  Medication   cyanocobalamin ((VITAMIN B-12)) injection 1,000 mcg    Past Surgical History:  Procedure Laterality Date   ABDOMINAL HYSTERECTOMY     CATARACT EXTRACTION, BILATERAL     TONSILLECTOMY AND ADENOIDECTOMY      Family History  Problem Relation Age of Onset   COPD Father       Controlled substance contract: n/a     Review of Systems  Constitutional:  Negative for diaphoresis.  Eyes:  Negative for pain.  Respiratory:  Negative for shortness of breath.   Cardiovascular:  Negative for chest pain, palpitations and leg swelling.  Gastrointestinal:  Negative for abdominal pain.  Endocrine: Negative for polydipsia.  Skin:  Negative for rash.  Neurological:  Negative for dizziness, weakness and headaches.  Hematological:  Does not bruise/bleed easily.  All other systems reviewed and are negative.      Objective:   Physical Exam Vitals and nursing note reviewed.  Constitutional:      General: She is not in acute distress.    Appearance: Normal appearance. She is well-developed.  HENT:     Head: Normocephalic.     Right Ear: Tympanic membrane normal.     Left Ear: Tympanic membrane normal.     Nose: Nose normal.     Mouth/Throat:     Mouth: Mucous membranes are moist.  Eyes:     Pupils: Pupils are equal, round, and reactive to  light.  Neck:     Vascular: No carotid bruit or JVD.  Cardiovascular:     Rate and Rhythm: Normal rate and regular rhythm.     Heart sounds: Normal heart sounds.  Pulmonary:     Effort: Pulmonary effort is normal. No respiratory distress.     Breath sounds: Normal breath sounds. No wheezing or rales.  Chest:     Chest wall: No tenderness.  Abdominal:     General: Bowel sounds are normal. There is no distension or abdominal bruit.     Palpations: Abdomen is soft. There is no hepatomegaly, splenomegaly, mass or pulsatile mass.     Tenderness: There is no abdominal tenderness.  Musculoskeletal:        General: Normal range of motion.     Cervical back: Normal range of motion and neck supple.  Lymphadenopathy:     Cervical: No cervical adenopathy.  Skin:    General: Skin is warm and dry.  Neurological:     Mental Status: She is alert and oriented to person, place, and time.     Deep Tendon Reflexes: Reflexes are normal and symmetric.  Psychiatric:        Behavior: Behavior normal.        Thought Content: Thought content normal.        Judgment: Judgment normal.     BP 136/79   Pulse 90   Temp (!) 97.5 F (36.4 C) (Temporal)   Resp 20   Ht 5\' 5"  (1.651 m)   Wt 170 lb (77.1 kg)   SpO2 98%   BMI 28.29 kg/m        Assessment & Plan:   Emma Nunez comes in today with chief complaint of Medical Management of Chronic Issues   Diagnosis and orders addressed:  1. Essential hypertension, benign Low sodium diet - CBC with Differential/Platelet - CMP14+EGFR - hydrochlorothiazide (HYDRODIURIL) 25 MG tablet; Take 1 tablet (25 mg total) by mouth daily.  Dispense: 90 tablet; Refill: 1 - lisinopril (ZESTRIL) 40 MG tablet; Take 1 tablet (40 mg total) by mouth daily.  Dispense: 90 tablet; Refill: 1  2. Diabetes mellitus treated with oral medication (HCC) Continue to watch carbs in diet - Bayer DCA Hb A1c Waived Patient is going to hold metformin for now - metFORMIN  (GLUCOPHAGE) 1000 MG tablet; Take 1 tablet (1,000 mg total) by mouth 2 (two) times daily with a meal.  Dispense: 180 tablet; Refill: 1  3. Diabetes mellitus treated with injections of non-insulin medication (HCC) Increased ozempic to 1mg  a week - Semaglutide, 1 MG/DOSE, 4 MG/3ML SOPN; Inject 1 mg as directed once a week.  Dispense: 3 mL; Refill: 2  4. Hyperlipidemia with target LDL less than 100 Low fat diet - Lipid panel - atorvastatin (LIPITOR) 40  MG tablet; Take 1 tablet (40 mg total) by mouth daily.  Dispense: 90 tablet; Refill: 1 - fenofibrate 160 MG tablet; Take 1 tablet (160 mg total) by mouth daily.  Dispense: 90 tablet; Refill: 1  5. Vitamin B12 deficiency anemia due to intrinsic factor deficiency Continue b12 injections  6. BMI 37.0-37.9, adult Discussed diet and exercise for person with BMI >25 Will recheck weight in 3-6 months    Labs pending Health Maintenance reviewed Diet and exercise encouraged  Follow up plan: 6 months   Harleigh-Margaret Daphine Deutscher, FNP

## 2023-10-20 LAB — CMP14+EGFR
ALT: 11 [IU]/L (ref 0–32)
AST: 19 [IU]/L (ref 0–40)
Albumin: 4.2 g/dL (ref 3.8–4.8)
Alkaline Phosphatase: 51 [IU]/L (ref 44–121)
BUN/Creatinine Ratio: 23 (ref 12–28)
BUN: 25 mg/dL (ref 8–27)
Bilirubin Total: 0.2 mg/dL (ref 0.0–1.2)
CO2: 22 mmol/L (ref 20–29)
Calcium: 10 mg/dL (ref 8.7–10.3)
Chloride: 100 mmol/L (ref 96–106)
Creatinine, Ser: 1.08 mg/dL — ABNORMAL HIGH (ref 0.57–1.00)
Globulin, Total: 2.3 g/dL (ref 1.5–4.5)
Glucose: 123 mg/dL — ABNORMAL HIGH (ref 70–99)
Potassium: 4.4 mmol/L (ref 3.5–5.2)
Sodium: 140 mmol/L (ref 134–144)
Total Protein: 6.5 g/dL (ref 6.0–8.5)
eGFR: 55 mL/min/{1.73_m2} — ABNORMAL LOW (ref >59)

## 2023-10-20 LAB — CBC WITH DIFFERENTIAL/PLATELET
Basophils Absolute: 0.1 10*3/uL (ref 0.0–0.2)
Basos: 1 %
EOS (ABSOLUTE): 0.2 10*3/uL (ref 0.0–0.4)
Eos: 2 %
Hematocrit: 38.5 % (ref 34.0–46.6)
Hemoglobin: 12.1 g/dL (ref 11.1–15.9)
Immature Grans (Abs): 0 10*3/uL (ref 0.0–0.1)
Immature Granulocytes: 0 %
Lymphocytes Absolute: 2.6 10*3/uL (ref 0.7–3.1)
Lymphs: 24 %
MCH: 28.3 pg (ref 26.6–33.0)
MCHC: 31.4 g/dL — ABNORMAL LOW (ref 31.5–35.7)
MCV: 90 fL (ref 79–97)
Monocytes Absolute: 0.5 10*3/uL (ref 0.1–0.9)
Monocytes: 4 %
Neutrophils Absolute: 7.4 10*3/uL — ABNORMAL HIGH (ref 1.4–7.0)
Neutrophils: 69 %
Platelets: 611 10*3/uL — ABNORMAL HIGH (ref 150–450)
RBC: 4.27 x10E6/uL (ref 3.77–5.28)
RDW: 13 % (ref 11.7–15.4)
WBC: 10.8 10*3/uL (ref 3.4–10.8)

## 2023-10-20 LAB — LIPID PANEL
Chol/HDL Ratio: 3 ratio (ref 0.0–4.4)
Cholesterol, Total: 163 mg/dL (ref 100–199)
HDL: 55 mg/dL (ref >39)
LDL Chol Calc (NIH): 88 mg/dL (ref 0–99)
Triglycerides: 110 mg/dL (ref 0–149)
VLDL Cholesterol Cal: 20 mg/dL (ref 5–40)

## 2023-11-01 ENCOUNTER — Ambulatory Visit (INDEPENDENT_AMBULATORY_CARE_PROVIDER_SITE_OTHER): Payer: BC Managed Care – PPO | Admitting: *Deleted

## 2023-11-01 DIAGNOSIS — E538 Deficiency of other specified B group vitamins: Secondary | ICD-10-CM | POA: Diagnosis not present

## 2023-11-01 DIAGNOSIS — D510 Vitamin B12 deficiency anemia due to intrinsic factor deficiency: Secondary | ICD-10-CM

## 2023-11-01 NOTE — Progress Notes (Addendum)
B12 injection given right deltoid intramuscular, patient tolerated well

## 2023-11-30 ENCOUNTER — Encounter: Payer: Self-pay | Admitting: Nurse Practitioner

## 2023-11-30 LAB — HM DIABETES EYE EXAM

## 2023-12-01 ENCOUNTER — Ambulatory Visit (INDEPENDENT_AMBULATORY_CARE_PROVIDER_SITE_OTHER): Payer: BC Managed Care – PPO | Admitting: *Deleted

## 2023-12-01 DIAGNOSIS — D510 Vitamin B12 deficiency anemia due to intrinsic factor deficiency: Secondary | ICD-10-CM

## 2023-12-01 NOTE — Progress Notes (Signed)
Pt given B12 injection IM left deltoid and tolerated well. °

## 2023-12-06 ENCOUNTER — Telehealth: Payer: Self-pay

## 2023-12-06 NOTE — Patient Outreach (Signed)
 [facility] Assistant attempted to call patient on today regarding preventative mammogram screening. No answer from patient after multiple rings. Assistant unable to leave confidential voicemail for patient to return call.  Will call back patient back for final attempt.   Baruch Gouty /VBCI  [facility] Assistant-Population Health [phone]

## 2023-12-24 ENCOUNTER — Encounter: Payer: Self-pay | Admitting: Nurse Practitioner

## 2024-01-03 ENCOUNTER — Ambulatory Visit (INDEPENDENT_AMBULATORY_CARE_PROVIDER_SITE_OTHER): Payer: BC Managed Care – PPO

## 2024-01-03 DIAGNOSIS — D510 Vitamin B12 deficiency anemia due to intrinsic factor deficiency: Secondary | ICD-10-CM

## 2024-01-03 DIAGNOSIS — E538 Deficiency of other specified B group vitamins: Secondary | ICD-10-CM

## 2024-01-03 NOTE — Progress Notes (Signed)
 B12 injection given right deltoid patient tolerated well

## 2024-01-19 ENCOUNTER — Other Ambulatory Visit: Payer: Self-pay | Admitting: Nurse Practitioner

## 2024-01-19 DIAGNOSIS — E119 Type 2 diabetes mellitus without complications: Secondary | ICD-10-CM

## 2024-02-03 ENCOUNTER — Ambulatory Visit (INDEPENDENT_AMBULATORY_CARE_PROVIDER_SITE_OTHER): Payer: BC Managed Care – PPO | Admitting: Family Medicine

## 2024-02-03 DIAGNOSIS — E538 Deficiency of other specified B group vitamins: Secondary | ICD-10-CM | POA: Diagnosis not present

## 2024-02-03 DIAGNOSIS — D510 Vitamin B12 deficiency anemia due to intrinsic factor deficiency: Secondary | ICD-10-CM

## 2024-02-16 ENCOUNTER — Encounter: Payer: Self-pay | Admitting: *Deleted

## 2024-03-02 ENCOUNTER — Ambulatory Visit (INDEPENDENT_AMBULATORY_CARE_PROVIDER_SITE_OTHER): Payer: BC Managed Care – PPO

## 2024-03-02 DIAGNOSIS — E538 Deficiency of other specified B group vitamins: Secondary | ICD-10-CM | POA: Diagnosis not present

## 2024-03-02 DIAGNOSIS — D510 Vitamin B12 deficiency anemia due to intrinsic factor deficiency: Secondary | ICD-10-CM

## 2024-03-02 NOTE — Progress Notes (Signed)
 Patient is in office today for a nurse visit for B12 Injection. Patient Injection was given in the  Right deltoid. Patient tolerated injection well.

## 2024-03-02 NOTE — Progress Notes (Signed)
 Emma Daphine Deutscher, FNP

## 2024-04-04 ENCOUNTER — Ambulatory Visit (INDEPENDENT_AMBULATORY_CARE_PROVIDER_SITE_OTHER)

## 2024-04-04 ENCOUNTER — Ambulatory Visit: Payer: BC Managed Care – PPO | Admitting: Nurse Practitioner

## 2024-04-04 DIAGNOSIS — E538 Deficiency of other specified B group vitamins: Secondary | ICD-10-CM | POA: Diagnosis not present

## 2024-04-04 NOTE — Progress Notes (Signed)
 Patient is in office today for a nurse visit for B12 Injection. Patient Injection was given in the  Left deltoid. Patient tolerated injection well.

## 2024-04-11 ENCOUNTER — Ambulatory Visit (INDEPENDENT_AMBULATORY_CARE_PROVIDER_SITE_OTHER): Payer: BC Managed Care – PPO | Admitting: Nurse Practitioner

## 2024-04-11 ENCOUNTER — Encounter: Payer: Self-pay | Admitting: Nurse Practitioner

## 2024-04-11 VITALS — BP 132/76 | HR 74 | Temp 97.6°F | Ht 65.0 in | Wt 166.0 lb

## 2024-04-11 DIAGNOSIS — E119 Type 2 diabetes mellitus without complications: Secondary | ICD-10-CM

## 2024-04-11 DIAGNOSIS — Z6837 Body mass index (BMI) 37.0-37.9, adult: Secondary | ICD-10-CM

## 2024-04-11 DIAGNOSIS — E785 Hyperlipidemia, unspecified: Secondary | ICD-10-CM | POA: Diagnosis not present

## 2024-04-11 DIAGNOSIS — Z7985 Long-term (current) use of injectable non-insulin antidiabetic drugs: Secondary | ICD-10-CM

## 2024-04-11 DIAGNOSIS — Z7984 Long term (current) use of oral hypoglycemic drugs: Secondary | ICD-10-CM | POA: Diagnosis not present

## 2024-04-11 DIAGNOSIS — D510 Vitamin B12 deficiency anemia due to intrinsic factor deficiency: Secondary | ICD-10-CM

## 2024-04-11 DIAGNOSIS — I10 Essential (primary) hypertension: Secondary | ICD-10-CM

## 2024-04-11 LAB — LIPID PANEL

## 2024-04-11 LAB — BAYER DCA HB A1C WAIVED: HB A1C (BAYER DCA - WAIVED): 5 % (ref 4.8–5.6)

## 2024-04-11 MED ORDER — FENOFIBRATE 160 MG PO TABS: 160.0000 mg | ORAL_TABLET | Freq: Every day | ORAL | 1 refills | Status: DC

## 2024-04-11 MED ORDER — ATORVASTATIN CALCIUM 40 MG PO TABS: 40.0000 mg | ORAL_TABLET | Freq: Every day | ORAL | 1 refills | Status: DC

## 2024-04-11 MED ORDER — HYDROCHLOROTHIAZIDE 25 MG PO TABS: 25.0000 mg | ORAL_TABLET | Freq: Every day | ORAL | 1 refills | Status: DC

## 2024-04-11 MED ORDER — LISINOPRIL 40 MG PO TABS: 40.0000 mg | ORAL_TABLET | Freq: Every day | ORAL | 1 refills | Status: DC

## 2024-04-11 MED ORDER — OZEMPIC (1 MG/DOSE) 4 MG/3ML ~~LOC~~ SOPN: 1.0000 mg | PEN_INJECTOR | SUBCUTANEOUS | 1 refills | Status: DC

## 2024-04-11 NOTE — Patient Instructions (Signed)

## 2024-04-11 NOTE — Progress Notes (Signed)
 Subjective:    Patient ID: Emma Nunez, female    DOB: 27-Aug-1951, 73 y.o.   MRN: [id number]   Chief Complaint: medical management of chronic issues     HPI:  Emma Nunez is a 73 y.o. who identifies as a female who was assigned female at birth.   Social history: Lives with: husband Work history: retired   Water engineer in today for follow up of the following chronic medical issues:  1. Essential hypertension, benign No c/o chest pain, sob or headache. Doe snot check blood pressure at home. BP Readings from Last 3 Encounters:  10/19/23 136/79  04/20/23 137/80  01/18/23 (!) 142/77     2. Diabetes mellitus treated with oral medication (HCC) 3. Diabetes mellitus treated with injections of non-insulin medication (HCC) Fasting blood sugars are running around 100-120. Really does not check it very often Lab Results  Component Value Date   HGBA1C 5.3 10/19/2023     4. Hyperlipidemia with target LDL less than 100 Does try to watch diet but does no dedicated exercise. Lab Results  Component Value Date   CHOL 163 10/19/2023   HDL 55 10/19/2023   LDLCALC 88 10/19/2023   TRIG 110 10/19/2023   CHOLHDL 3.0 10/19/2023   The 10-year ASCVD risk score (Arnett DK, et al., 2019) is: 28.2%   5. Vitamin B12 deficiency anemia due to intrinsic factor deficiency Gets monthly b12 injections  6. BMI 37.0-37.9, adult Weight is down 4lbs   Wt Readings from Last 3 Encounters:  04/11/24 166 lb (75.3 kg)  10/19/23 170 lb (77.1 kg)  04/20/23 185 lb (83.9 kg)   BMI Readings from Last 3 Encounters:  04/11/24 27.62 kg/m  10/19/23 28.29 kg/m  04/20/23 30.79 kg/m       New complaints: None today  No Known Allergies Outpatient Encounter Medications as of 04/11/2024  Medication Sig   atorvastatin  (LIPITOR) 40 MG tablet Take 1 tablet (40 mg total) by mouth daily.   Blood Glucose Monitoring Suppl (CONTOUR NEXT MONITOR) w/Device KIT USE AS DIRECTED   fenofibrate  160 MG tablet Take 1  tablet (160 mg total) by mouth daily.   glucose blood (ACCU-CHEK GUIDE) test strip Check BS up to 4 times daily Dx E11.9   hydrochlorothiazide  (HYDRODIURIL ) 25 MG tablet Take 1 tablet (25 mg total) by mouth daily.   LANCETS MICRO THIN 33G MISC Use to check BG once daily.  Dispense whichever goes with meter and that insurance will cover.   lisinopril  (ZESTRIL ) 40 MG tablet Take 1 tablet (40 mg total) by mouth daily.   metFORMIN  (GLUCOPHAGE ) 1000 MG tablet Take 1 tablet (1,000 mg total) by mouth 2 (two) times daily with a meal.   Semaglutide , 1 MG/DOSE, (OZEMPIC , 1 MG/DOSE,) 4 MG/3ML SOPN INJECT ONE MG AS DIRECTED ONCE A WEEK   Facility-Administered Encounter Medications as of 04/11/2024  Medication   cyanocobalamin  ((VITAMIN B-12)) injection 1,000 mcg    Past Surgical History:  Procedure Laterality Date   ABDOMINAL HYSTERECTOMY     CATARACT EXTRACTION, BILATERAL     TONSILLECTOMY AND ADENOIDECTOMY      Family History  Problem Relation Age of Onset   COPD Father       Controlled substance contract: n/a     Review of Systems  Constitutional:  Negative for diaphoresis.  Eyes:  Negative for pain.  Respiratory:  Negative for shortness of breath.   Cardiovascular:  Negative for chest pain, palpitations and leg swelling.  Gastrointestinal:  Negative for abdominal  pain.  Endocrine: Negative for polydipsia.  Skin:  Negative for rash.  Neurological:  Negative for dizziness, weakness and headaches.  Hematological:  Does not bruise/bleed easily.  All other systems reviewed and are negative.      Objective:   Physical Exam Vitals and nursing note reviewed.  Constitutional:      General: She is not in acute distress.    Appearance: Normal appearance. She is well-developed.  HENT:     Head: Normocephalic.     Right Ear: Tympanic membrane normal.     Left Ear: Tympanic membrane normal.     Nose: Nose normal.     Mouth/Throat:     Mouth: Mucous membranes are moist.  Eyes:      Pupils: Pupils are equal, round, and reactive to light.  Neck:     Vascular: No carotid bruit or JVD.  Cardiovascular:     Rate and Rhythm: Normal rate and regular rhythm.     Heart sounds: Normal heart sounds.  Pulmonary:     Effort: Pulmonary effort is normal. No respiratory distress.     Breath sounds: Normal breath sounds. No wheezing or rales.  Chest:     Chest wall: No tenderness.  Abdominal:     General: Bowel sounds are normal. There is no distension or abdominal bruit.     Palpations: Abdomen is soft. There is no hepatomegaly, splenomegaly, mass or pulsatile mass.     Tenderness: There is no abdominal tenderness.  Musculoskeletal:        General: Normal range of motion.     Cervical back: Normal range of motion and neck supple.  Lymphadenopathy:     Cervical: No cervical adenopathy.  Skin:    General: Skin is warm and dry.  Neurological:     Mental Status: She is alert and oriented to person, place, and time.     Deep Tendon Reflexes: Reflexes are normal and symmetric.  Psychiatric:        Behavior: Behavior normal.        Thought Content: Thought content normal.        Judgment: Judgment normal.     BP 132/76   Pulse 74   Temp 97.6 F (36.4 C) (Temporal)   Ht 5\' 5"  (1.651 m)   Wt 166 lb (75.3 kg)   SpO2 99%   BMI 27.62 kg/m   Hgba1c 5.0%      Assessment & Plan:   Emma Nunez comes in today with chief complaint of medical management of chronic issues    Diagnosis and orders addressed:  1. Essential hypertension, benign Low sodium diet - CBC with Differential/Platelet - CMP14+EGFR - hydrochlorothiazide  (HYDRODIURIL ) 25 MG tablet; Take 1 tablet (25 mg total) by mouth daily.  Dispense: 90 tablet; Refill: 1 - lisinopril  (ZESTRIL ) 40 MG tablet; Take 1 tablet (40 mg total) by mouth daily.  Dispense: 90 tablet; Refill: 1  2. Diabetes mellitus treated with oral medication (HCC) Continue to watch carbs in diet - Bayer DCA Hb A1c Waived Patient is going  to hold metformin  for now - metFORMIN  (GLUCOPHAGE ) 1000 MG tablet; Take 1 tablet (1,000 mg total) by mouth 2 (two) times daily with a meal.  Dispense: 180 tablet; Refill: 1  3. Diabetes mellitus treated with injections of non-insulin medication (HCC) Increased ozempic  to 1mg  a week - Semaglutide , 1 MG/DOSE, 4 MG/3ML SOPN; Inject 1 mg as directed once a week.  Dispense: 3 mL; Refill: 2  4. Hyperlipidemia with target LDL  less than 100 Low fat diet - Lipid panel - atorvastatin  (LIPITOR) 40 MG tablet; Take 1 tablet (40 mg total) by mouth daily.  Dispense: 90 tablet; Refill: 1 - fenofibrate  160 MG tablet; Take 1 tablet (160 mg total) by mouth daily.  Dispense: 90 tablet; Refill: 1  5. Vitamin B12 deficiency anemia due to intrinsic factor deficiency Continue b12 injections  6. BMI 37.0-37.9, adult Discussed diet and exercise for person with BMI >25 Will recheck weight in 3-6 months    Labs pending Health Maintenance reviewed Diet and exercise encouraged  Follow up plan: 6 months   Emma Gaylyn Keas, FNP

## 2024-04-12 LAB — MICROALBUMIN / CREATININE URINE RATIO
Creatinine, Urine: 103.5 mg/dL
Microalb/Creat Ratio: 7 mg/g{creat} (ref 0–29)
Microalbumin, Urine: 7.1 ug/mL

## 2024-04-12 LAB — LIPID PANEL
Cholesterol, Total: 179 mg/dL (ref 100–199)
HDL: 47 mg/dL (ref >39)
LDL CALC COMMENT:: 3.8 ratio (ref 0.0–4.4)
LDL Chol Calc (NIH): 105 mg/dL — ABNORMAL HIGH (ref 0–99)
Triglycerides: 154 mg/dL — ABNORMAL HIGH (ref 0–149)
VLDL Cholesterol Cal: 27 mg/dL (ref 5–40)

## 2024-04-12 LAB — CBC WITH DIFFERENTIAL/PLATELET
Basophils Absolute: 0.1 10*3/uL (ref 0.0–0.2)
Basos: 1 %
EOS (ABSOLUTE): 0.2 10*3/uL (ref 0.0–0.4)
Eos: 2 %
Hematocrit: 36.8 % (ref 34.0–46.6)
Hemoglobin: 12 g/dL (ref 11.1–15.9)
Immature Grans (Abs): 0 10*3/uL (ref 0.0–0.1)
Immature Granulocytes: 0 %
Lymphocytes Absolute: 2.6 10*3/uL (ref 0.7–3.1)
Lymphs: 29 %
MCH: 28.8 pg (ref 26.6–33.0)
MCHC: 32.6 g/dL (ref 31.5–35.7)
MCV: 89 fL (ref 79–97)
Monocytes Absolute: 0.4 10*3/uL (ref 0.1–0.9)
Monocytes: 4 %
Neutrophils Absolute: 5.8 10*3/uL (ref 1.4–7.0)
Neutrophils: 64 %
Platelets: 554 10*3/uL — ABNORMAL HIGH (ref 150–450)
RBC: 4.16 x10E6/uL (ref 3.77–5.28)
RDW: 13.1 % (ref 11.7–15.4)
WBC: 9 10*3/uL (ref 3.4–10.8)

## 2024-04-12 LAB — CMP14+EGFR
ALT: 14 IU/L (ref 0–32)
AST: 23 IU/L (ref 0–40)
Albumin: 4.3 g/dL (ref 3.8–4.8)
Alkaline Phosphatase: 62 IU/L (ref 44–121)
BUN/Creatinine Ratio: 24 (ref 12–28)
BUN: 26 mg/dL (ref 8–27)
Bilirubin Total: 0.3 mg/dL (ref 0.0–1.2)
CO2: 23 mmol/L (ref 20–29)
Calcium: 9.8 mg/dL (ref 8.7–10.3)
Chloride: 101 mmol/L (ref 96–106)
Creatinine, Ser: 1.09 mg/dL — ABNORMAL HIGH (ref 0.57–1.00)
Globulin, Total: 2.4 g/dL (ref 1.5–4.5)
Glucose: 100 mg/dL — ABNORMAL HIGH (ref 70–99)
Potassium: 3.9 mmol/L (ref 3.5–5.2)
Sodium: 138 mmol/L (ref 134–144)
Total Protein: 6.7 g/dL (ref 6.0–8.5)
eGFR: 54 mL/min/{1.73_m2} — ABNORMAL LOW (ref >59)

## 2024-04-12 LAB — VITAMIN B12: Vitamin B-12: 855 pg/mL (ref 232–1245)

## 2024-05-03 ENCOUNTER — Ambulatory Visit

## 2024-05-03 DIAGNOSIS — E538 Deficiency of other specified B group vitamins: Secondary | ICD-10-CM | POA: Diagnosis not present

## 2024-05-03 NOTE — Progress Notes (Signed)
 Patient is in office today for a nurse visit for B12 Injection. Patient Injection was given in the  Right deltoid. Patient tolerated injection well.

## 2024-05-04 ENCOUNTER — Ambulatory Visit

## 2024-06-12 ENCOUNTER — Ambulatory Visit (INDEPENDENT_AMBULATORY_CARE_PROVIDER_SITE_OTHER)

## 2024-06-12 DIAGNOSIS — E538 Deficiency of other specified B group vitamins: Secondary | ICD-10-CM | POA: Diagnosis not present

## 2024-06-12 NOTE — Progress Notes (Signed)
 Patient is in office today for a nurse visit for B12 Injection. Patient Injection was given in the  Left deltoid. Patient tolerated injection well.

## 2024-07-13 ENCOUNTER — Ambulatory Visit

## 2024-07-13 DIAGNOSIS — E538 Deficiency of other specified B group vitamins: Secondary | ICD-10-CM

## 2024-07-13 NOTE — Progress Notes (Cosign Needed Addendum)
 Patient is in office today for a nurse visit for B12 Injection. Patient Injection was given in the  Right deltoid. Patient tolerated injection well.

## 2024-08-14 ENCOUNTER — Ambulatory Visit (INDEPENDENT_AMBULATORY_CARE_PROVIDER_SITE_OTHER): Admitting: *Deleted

## 2024-08-14 DIAGNOSIS — E538 Deficiency of other specified B group vitamins: Secondary | ICD-10-CM

## 2024-08-14 NOTE — Progress Notes (Signed)
 Patient is in office today for a nurse visit for B12 Injection. Patient Injection was given in the  Left deltoid. Patient tolerated injection well.

## 2024-08-24 ENCOUNTER — Ambulatory Visit: Payer: Self-pay

## 2024-08-24 NOTE — Telephone Encounter (Signed)
 FYI Only or Action Required?: Action required by provider: request for appointment.  Patient was last seen in primary care on 04/11/2024 by Gladis Mustard, FNP.  Called Nurse Triage reporting Sore Throat.  Symptoms began several days ago.  Interventions attempted: OTC medications: tylenol.  Symptoms are: unchanged.  Triage Disposition: See Physician Within 24 Hours  Patient/caregiver understands and will follow disposition?: YesCopied from CRM #[id number]. Topic: Clinical - Red Word Triage >> Aug 24, 2024  8:23 AM Emma Nunez wrote: Red Word that prompted transfer to Nurse Triage: Patient thinks she may have strep, has an extremely sore throat, body aches, would like to have a strep test. Reason for Disposition  SEVERE throat pain (e.g., excruciating)  Answer Assessment - Initial Assessment Questions 1. ONSET: When did the throat start hurting? (Hours or days ago)      Monday  3. STREP EXPOSURE: Has there been any exposure to strep within the past week? If Yes, ask: What type of contact occurred?      Not sure 4.  VIRAL SYMPTOMS: Are there any symptoms of a cold, such as a runny nose, cough, hoarse voice or red eyes?      Body aches, cough 5. FEVER: Do you have a fever? If Yes, ask: What is your temperature, how was it measured, and when did it start?     Not sure 6. PUS ON THE TONSILS: Is there pus on the tonsils in the back of your throat?     Throat is red 7. OTHER SYMPTOMS: Do you have any other symptoms? (e.g., difficulty breathing, headache, rash)     denies  Protocols used: Sore Throat-A-AH

## 2024-08-24 NOTE — Telephone Encounter (Signed)
 Apt scheduled.

## 2024-08-25 ENCOUNTER — Ambulatory Visit (INDEPENDENT_AMBULATORY_CARE_PROVIDER_SITE_OTHER): Admitting: Nurse Practitioner

## 2024-08-25 ENCOUNTER — Encounter: Payer: Self-pay | Admitting: Nurse Practitioner

## 2024-08-25 VITALS — BP 147/92 | HR 99 | Temp 96.9°F | Ht 65.0 in | Wt 162.0 lb

## 2024-08-25 DIAGNOSIS — U071 COVID-19: Secondary | ICD-10-CM | POA: Diagnosis not present

## 2024-08-25 MED ORDER — PROMETHAZINE-DM 6.25-15 MG/5ML PO SYRP: 5.0000 mL | ORAL_SOLUTION | Freq: Four times a day (QID) | ORAL | 0 refills | Status: DC | PRN

## 2024-08-25 NOTE — Progress Notes (Signed)
 Subjective:    Patient ID: Emma Nunez, female    DOB: May 24, 1951, 73 y.o.   MRN: [id number]   Chief Complaint: Covid positive  Cough Associated symptoms include a sore throat. Pertinent negatives include no chills or fever.  Sore Throat  Associated symptoms include congestion and coughing.    Patient in today saying that she tested positive for covid last nuht at home. She actually started getting sick on Tuesday with sore throat. No fever. Slight body aches.  Patient Active Problem List   Diagnosis Date Noted   Diabetes mellitus treated with injections of non-insulin medication (HCC) 10/19/2023   Diabetes mellitus treated with oral medication (HCC) 07/06/2017   Dysfunction of left eustachian tube 09/29/2016   BMI 37.0-37.9, adult 02/07/2015   Essential hypertension, benign 10/29/2014   Hyperlipidemia with target LDL less than 100 10/29/2014   B12 deficiency anemia 10/29/2014       Review of Systems  Constitutional:  Negative for chills and fever.  HENT:  Positive for congestion and sore throat.   Respiratory:  Positive for cough.        Objective:   Physical Exam Constitutional:      Appearance: Normal appearance.  HENT:     Right Ear: Tympanic membrane normal.     Left Ear: Tympanic membrane normal.     Nose: Congestion and rhinorrhea present.     Mouth/Throat:     Pharynx: Posterior oropharyngeal erythema present. No oropharyngeal exudate.  Cardiovascular:     Rate and Rhythm: Normal rate and regular rhythm.     Heart sounds: Normal heart sounds.  Pulmonary:     Breath sounds: Normal breath sounds.  Skin:    General: Skin is warm.  Neurological:     General: No focal deficit present.     Mental Status: She is alert and oriented to person, place, and time.  Psychiatric:        Mood and Affect: Mood normal.        Behavior: Behavior normal.     BP (!) 147/92   Pulse 99   Temp (!) 96.9 F (36.1 C) (Temporal)   Ht 5' 5 (1.651 m)   Wt 162 lb (73.5  kg)   SpO2 94%   BMI 26.96 kg/m        Assessment & Plan:   Emma Nunez in today with chief complaint of Cough, Sore Throat, and Generalized Body Aches (Positive covid test at home)   1. Positive self-administered antigen test for COVID-19 (Primary) 1. Take meds as prescribed 2. Use a cool mist humidifier especially during the winter months and when heat has been humid. 3. Use saline nose sprays frequently 4. Saline irrigations of the nose can be very helpful if done frequently.  * 4X daily for 1 week*  * Use of a nettie pot can be helpful with this. Follow directions with this* 5. Drink plenty of fluids 6. Keep thermostat turn down low 7.For any cough or congestion- promethazine  DM 8. For fever or aces or pains- take tylenol or ibuprofen appropriate for age and weight.  * for fevers greater than 101 orally you may alternate ibuprofen and tylenol every  3 hours.    Meds ordered this encounter  Medications   promethazine -dextromethorphan (PROMETHAZINE -DM) 6.25-15 MG/5ML syrup    Sig: Take 5 mLs by mouth 4 (four) times daily as needed.    Dispense:  118 mL    Refill:  0    Supervising Provider:  DETTINGER, JOSHUA A [[id number]]       The above assessment and management plan was discussed with the patient. The patient verbalized understanding of and has agreed to the management plan. Patient is aware to call the clinic if symptoms persist or worsen. Patient is aware when to return to the clinic for a follow-up visit. Patient educated on when it is appropriate to go to the emergency department.   Emma Gladis, FNP

## 2024-08-25 NOTE — Patient Instructions (Signed)

## 2024-09-14 ENCOUNTER — Ambulatory Visit (INDEPENDENT_AMBULATORY_CARE_PROVIDER_SITE_OTHER): Admitting: *Deleted

## 2024-09-14 DIAGNOSIS — E538 Deficiency of other specified B group vitamins: Secondary | ICD-10-CM | POA: Diagnosis not present

## 2024-09-14 NOTE — Progress Notes (Signed)
 Patient is in office today for a nurse visit for B12 Injection. Patient Injection was given in the  Right deltoid. Patient tolerated injection well.

## 2024-10-02 ENCOUNTER — Other Ambulatory Visit: Payer: Self-pay | Admitting: Nurse Practitioner

## 2024-10-02 DIAGNOSIS — E119 Type 2 diabetes mellitus without complications: Secondary | ICD-10-CM

## 2024-10-06 ENCOUNTER — Ambulatory Visit: Admitting: Nurse Practitioner

## 2024-10-16 ENCOUNTER — Ambulatory Visit (INDEPENDENT_AMBULATORY_CARE_PROVIDER_SITE_OTHER)

## 2024-10-16 DIAGNOSIS — E538 Deficiency of other specified B group vitamins: Secondary | ICD-10-CM

## 2024-10-16 DIAGNOSIS — D510 Vitamin B12 deficiency anemia due to intrinsic factor deficiency: Secondary | ICD-10-CM

## 2024-10-16 NOTE — Progress Notes (Signed)
 Patient is in office today for a nurse visit for B12 Injection. Patient Injection was given in the  Left deltoid. Patient tolerated injection well.

## 2024-10-31 ENCOUNTER — Ambulatory Visit: Payer: Self-pay

## 2024-10-31 ENCOUNTER — Encounter: Payer: Self-pay | Admitting: Nurse Practitioner

## 2024-10-31 ENCOUNTER — Other Ambulatory Visit: Payer: Self-pay | Admitting: Nurse Practitioner

## 2024-10-31 ENCOUNTER — Ambulatory Visit (INDEPENDENT_AMBULATORY_CARE_PROVIDER_SITE_OTHER): Payer: Self-pay | Admitting: Nurse Practitioner

## 2024-10-31 VITALS — BP 131/73 | HR 74 | Temp 97.0°F | Ht 65.0 in | Wt 162.0 lb

## 2024-10-31 DIAGNOSIS — E785 Hyperlipidemia, unspecified: Secondary | ICD-10-CM | POA: Diagnosis not present

## 2024-10-31 DIAGNOSIS — D510 Vitamin B12 deficiency anemia due to intrinsic factor deficiency: Secondary | ICD-10-CM

## 2024-10-31 DIAGNOSIS — E119 Type 2 diabetes mellitus without complications: Secondary | ICD-10-CM | POA: Diagnosis not present

## 2024-10-31 DIAGNOSIS — Z7984 Long term (current) use of oral hypoglycemic drugs: Secondary | ICD-10-CM

## 2024-10-31 DIAGNOSIS — I10 Essential (primary) hypertension: Secondary | ICD-10-CM

## 2024-10-31 DIAGNOSIS — Z780 Asymptomatic menopausal state: Secondary | ICD-10-CM

## 2024-10-31 DIAGNOSIS — Z7985 Long-term (current) use of injectable non-insulin antidiabetic drugs: Secondary | ICD-10-CM

## 2024-10-31 DIAGNOSIS — Z6837 Body mass index (BMI) 37.0-37.9, adult: Secondary | ICD-10-CM

## 2024-10-31 DIAGNOSIS — Z13820 Encounter for screening for osteoporosis: Secondary | ICD-10-CM

## 2024-10-31 LAB — LIPID PANEL

## 2024-10-31 LAB — BAYER DCA HB A1C WAIVED: HB A1C (BAYER DCA - WAIVED): 5.3 % (ref 4.8–5.6)

## 2024-10-31 MED ORDER — ATORVASTATIN CALCIUM 40 MG PO TABS: 40.0000 mg | ORAL_TABLET | Freq: Every day | ORAL | 1 refills | Status: AC

## 2024-10-31 MED ORDER — LISINOPRIL 40 MG PO TABS: 40.0000 mg | ORAL_TABLET | Freq: Every day | ORAL | 1 refills | Status: AC

## 2024-10-31 MED ORDER — FENOFIBRATE 160 MG PO TABS: 160.0000 mg | ORAL_TABLET | Freq: Every day | ORAL | 1 refills | Status: AC

## 2024-10-31 MED ORDER — HYDROCHLOROTHIAZIDE 25 MG PO TABS: 25.0000 mg | ORAL_TABLET | Freq: Every day | ORAL | 1 refills | Status: AC

## 2024-10-31 MED ORDER — OZEMPIC (2 MG/DOSE) 8 MG/3ML ~~LOC~~ SOPN: 2.0000 mg | PEN_INJECTOR | SUBCUTANEOUS | 6 refills | Status: AC

## 2024-10-31 NOTE — Progress Notes (Signed)
 Subjective:    Patient ID: Emma Nunez, female    DOB: [birth year], 73 y.o.   MRN: [id number]   Chief Complaint: medical management of chronic issues     HPI:  Emma Nunez is a 73 y.o. who identifies as a female who was assigned female at birth.   Social history: Lives with: husband Work history: retired   Water Engineer in today for follow up of the following chronic medical issues:  1. Essential hypertension, benign No c/o chest pain, sob or headache. Doe snot check blood pressure at home. BP Readings from Last 3 Encounters:  08/25/24 (!) 147/92  04/11/24 132/76  10/19/23 136/79     2. Diabetes mellitus treated with oral medication (HCC) 3. Diabetes mellitus treated with injections of non-insulin medication (HCC) Fasting blood sugars are running around 100-120. Really does not check it very often Lab Results  Component Value Date   HGBA1C 5.0 04/11/2024     4. Hyperlipidemia with target LDL less than 100 Does try to watch diet but does no dedicated exercise. Lab Results  Component Value Date   CHOL 179 04/11/2024   HDL 47 04/11/2024   LDLCALC 105 (H) 04/11/2024   TRIG 154 (H) 04/11/2024   CHOLHDL 3.8 04/11/2024   The 10-year ASCVD risk score (Arnett DK, et al., 2019) is: 38%   5. Vitamin B12 deficiency anemia due to intrinsic factor deficiency Gets monthly b12 injections  6. BMI 37.0-37.9, adult Weight is down 4lbs   Wt Readings from Last 3 Encounters:  08/25/24 162 lb (73.5 kg)  04/11/24 166 lb (75.3 kg)  10/19/23 170 lb (77.1 kg)   BMI Readings from Last 3 Encounters:  08/25/24 26.96 kg/m  04/11/24 27.62 kg/m  10/19/23 28.29 kg/m       New complaints: None today  No Known Allergies Outpatient Encounter Medications as of 10/31/2024  Medication Sig   atorvastatin  (LIPITOR) 40 MG tablet Take 1 tablet (40 mg total) by mouth daily.   Blood Glucose Monitoring Suppl (CONTOUR NEXT MONITOR) w/Device KIT USE AS DIRECTED   fenofibrate  160 MG tablet  Take 1 tablet (160 mg total) by mouth daily.   glucose blood (ACCU-CHEK GUIDE) test strip Check BS up to 4 times daily Dx E11.9   hydrochlorothiazide  (HYDRODIURIL ) 25 MG tablet Take 1 tablet (25 mg total) by mouth daily.   LANCETS MICRO THIN 33G MISC Use to check BG once daily.  Dispense whichever goes with meter and that insurance will cover.   lisinopril  (ZESTRIL ) 40 MG tablet Take 1 tablet (40 mg total) by mouth daily.   promethazine -dextromethorphan (PROMETHAZINE -DM) 6.25-15 MG/5ML syrup Take 5 mLs by mouth 4 (four) times daily as needed.   Semaglutide , 1 MG/DOSE, (OZEMPIC , 1 MG/DOSE,) 4 MG/3ML SOPN INJECT ONE MG ONCE WEEKLY   Facility-Administered Encounter Medications as of 10/31/2024  Medication   cyanocobalamin  ((VITAMIN B-12)) injection 1,000 mcg    Past Surgical History:  Procedure Laterality Date   ABDOMINAL HYSTERECTOMY     CATARACT EXTRACTION, BILATERAL     TONSILLECTOMY AND ADENOIDECTOMY      Family History  Problem Relation Age of Onset   COPD Father       Controlled substance contract: n/a     Review of Systems  Constitutional:  Negative for diaphoresis.  Eyes:  Negative for pain.  Respiratory:  Negative for shortness of breath.   Cardiovascular:  Negative for chest pain, palpitations and leg swelling.  Gastrointestinal:  Negative for abdominal pain.  Endocrine: Negative for  polydipsia.  Skin:  Negative for rash.  Neurological:  Negative for dizziness, weakness and headaches.  Hematological:  Does not bruise/bleed easily.  All other systems reviewed and are negative.      Objective:   Physical Exam Vitals and nursing note reviewed.  Constitutional:      General: She is not in acute distress.    Appearance: Normal appearance. She is well-developed.  HENT:     Head: Normocephalic.     Right Ear: Tympanic membrane normal.     Left Ear: Tympanic membrane normal.     Nose: Nose normal.     Mouth/Throat:     Mouth: Mucous membranes are moist.   Eyes:     Pupils: Pupils are equal, round, and reactive to light.  Neck:     Vascular: No carotid bruit or JVD.  Cardiovascular:     Rate and Rhythm: Normal rate and regular rhythm.     Heart sounds: Normal heart sounds.  Pulmonary:     Effort: Pulmonary effort is normal. No respiratory distress.     Breath sounds: Normal breath sounds. No wheezing or rales.  Chest:     Chest wall: No tenderness.  Abdominal:     General: Bowel sounds are normal. There is no distension or abdominal bruit.     Palpations: Abdomen is soft. There is no hepatomegaly, splenomegaly, mass or pulsatile mass.     Tenderness: There is no abdominal tenderness.  Musculoskeletal:        General: Normal range of motion.     Cervical back: Normal range of motion and neck supple.  Lymphadenopathy:     Cervical: No cervical adenopathy.  Skin:    General: Skin is warm and dry.  Neurological:     Mental Status: She is alert and oriented to person, place, and time.     Deep Tendon Reflexes: Reflexes are normal and symmetric.  Psychiatric:        Behavior: Behavior normal.        Thought Content: Thought content normal.        Judgment: Judgment normal.     BP 131/73   Pulse 74   Temp (!) 97 F (36.1 C) (Temporal)   Ht 5' 5 (1.651 m)   Wt 162 lb (73.5 kg)   SpO2 99%   BMI 26.96 kg/m    Hgba1c 5.3%      Assessment & Plan:   Ronal JAYSON Hurt comes in today with chief complaint of medical management of chronic issues    Diagnosis and orders addressed:  1. Essential hypertension, benign Low sodium diet - CBC with Differential/Platelet - CMP14+EGFR - hydrochlorothiazide  (HYDRODIURIL ) 25 MG tablet; Take 1 tablet (25 mg total) by mouth daily.  Dispense: 90 tablet; Refill: 1 - lisinopril  (ZESTRIL ) 40 MG tablet; Take 1 tablet (40 mg total) by mouth daily.  Dispense: 90 tablet; Refill: 1  2. Diabetes mellitus treated with oral medication (HCC) Continue to watch carbs in diet - Bayer DCA Hb A1c  Waived Patient is going to hold metformin  for now - metFORMIN  (GLUCOPHAGE ) 1000 MG tablet; Take 1 tablet (1,000 mg total) by mouth 2 (two) times daily with a meal.  Dispense: 180 tablet; Refill: 1  3. Diabetes mellitus treated with injections of non-insulin medication (HCC) Increased ozempic  to 1mg  a week - Semaglutide , 1 MG/DOSE, 4 MG/3ML SOPN; Inject 1 mg as directed once a week.  Dispense: 3 mL; Refill: 2  4. Hyperlipidemia with target LDL less than 100  Low fat diet - Lipid panel - atorvastatin  (LIPITOR) 40 MG tablet; Take 1 tablet (40 mg total) by mouth daily.  Dispense: 90 tablet; Refill: 1 - fenofibrate  160 MG tablet; Take 1 tablet (160 mg total) by mouth daily.  Dispense: 90 tablet; Refill: 1  5. Vitamin B12 deficiency anemia due to intrinsic factor deficiency Continue b12 injections  6. BMI 37.0-37.9, adult Discussed diet and exercise for person with BMI >25 Will recheck weight in 3-6 months    Labs pending Health Maintenance reviewed Diet and exercise encouraged  Follow up plan: 6 months   Konya-Margaret Gladis, FNP

## 2024-10-31 NOTE — Patient Instructions (Signed)

## 2024-11-01 LAB — LIPID PANEL
Cholesterol, Total: 160 mg/dL (ref 100–199)
HDL: 49 mg/dL (ref >39)
LDL CALC COMMENT:: 3.3 ratio (ref 0.0–4.4)
LDL Chol Calc (NIH): 91 mg/dL (ref 0–99)
Triglycerides: 108 mg/dL (ref 0–149)
VLDL Cholesterol Cal: 20 mg/dL (ref 5–40)

## 2024-11-01 LAB — CMP14+EGFR
ALT: 13 IU/L (ref 0–32)
AST: 24 IU/L (ref 0–40)
Albumin: 4.4 g/dL (ref 3.8–4.8)
Alkaline Phosphatase: 59 IU/L (ref 49–135)
BUN/Creatinine Ratio: 22 (ref 12–28)
BUN: 27 mg/dL (ref 8–27)
Bilirubin Total: 0.4 mg/dL (ref 0.0–1.2)
CO2: 22 mmol/L (ref 20–29)
Calcium: 9.9 mg/dL (ref 8.7–10.3)
Chloride: 98 mmol/L (ref 96–106)
Creatinine, Ser: 1.25 mg/dL — AB (ref 0.57–1.00)
Globulin, Total: 2.4 g/dL (ref 1.5–4.5)
Glucose: 81 mg/dL (ref 70–99)
Potassium: 3.8 mmol/L (ref 3.5–5.2)
Sodium: 137 mmol/L (ref 134–144)
Total Protein: 6.8 g/dL (ref 6.0–8.5)
eGFR: 46 mL/min/1.73 — AB (ref >59)

## 2024-11-01 LAB — CBC WITH DIFFERENTIAL/PLATELET
Basophils Absolute: 0.1 x10E3/uL (ref 0.0–0.2)
Basos: 1 %
EOS (ABSOLUTE): 0.2 x10E3/uL (ref 0.0–0.4)
Eos: 3 %
Hematocrit: 39.5 % (ref 34.0–46.6)
Hemoglobin: 12.9 g/dL (ref 11.1–15.9)
Immature Grans (Abs): 0 x10E3/uL (ref 0.0–0.1)
Immature Granulocytes: 0 %
Lymphocytes Absolute: 2 x10E3/uL (ref 0.7–3.1)
Lymphs: 28 %
MCH: 29.8 pg (ref 26.6–33.0)
MCHC: 32.7 g/dL (ref 31.5–35.7)
MCV: 91 fL (ref 79–97)
Monocytes Absolute: 0.4 x10E3/uL (ref 0.1–0.9)
Monocytes: 5 %
Neutrophils Absolute: 4.6 x10E3/uL (ref 1.4–7.0)
Neutrophils: 63 %
Platelets: 512 x10E3/uL — ABNORMAL HIGH (ref 150–450)
RBC: 4.33 x10E6/uL (ref 3.77–5.28)
RDW: 12.7 % (ref 11.7–15.4)
WBC: 7.2 x10E3/uL (ref 3.4–10.8)

## 2024-11-02 ENCOUNTER — Ambulatory Visit: Payer: Self-pay | Admitting: Nurse Practitioner

## 2024-11-21 ENCOUNTER — Ambulatory Visit: Payer: Self-pay | Admitting: *Deleted

## 2024-11-21 DIAGNOSIS — E538 Deficiency of other specified B group vitamins: Secondary | ICD-10-CM

## 2024-11-21 NOTE — Progress Notes (Signed)
 Patient is in office today for a nurse visit for B12 Injection. Patient Injection was given in the  Right deltoid. Patient tolerated injection well.

## 2024-12-18 ENCOUNTER — Other Ambulatory Visit: Payer: Self-pay | Admitting: Medical Genetics

## 2024-12-22 ENCOUNTER — Ambulatory Visit: Admitting: *Deleted

## 2024-12-22 DIAGNOSIS — E538 Deficiency of other specified B group vitamins: Secondary | ICD-10-CM

## 2024-12-22 NOTE — Progress Notes (Signed)
 Patient is in office today for a nurse visit for B12 Injection. Patient Injection was given in the  Left deltoid. Patient tolerated injection well.

## 2025-01-22 ENCOUNTER — Ambulatory Visit

## 2025-01-24 ENCOUNTER — Ambulatory Visit (INDEPENDENT_AMBULATORY_CARE_PROVIDER_SITE_OTHER): Admitting: *Deleted

## 2025-01-24 DIAGNOSIS — E538 Deficiency of other specified B group vitamins: Secondary | ICD-10-CM

## 2025-01-24 NOTE — Progress Notes (Signed)
 Patient is in office today for a nurse visit for B12 Injection. Patient Injection was given in the  Right deltoid. Patient tolerated injection well.

## 2025-02-21 ENCOUNTER — Ambulatory Visit

## 2025-04-27 ENCOUNTER — Ambulatory Visit: Admitting: Nurse Practitioner
